# Patient Record
Sex: Male | Born: 1940 | Race: White | Hispanic: No | Smoking: Never smoker
Health system: Southern US, Community
[De-identification: ages and names within clinical notes are randomized; demographics above are authoritative.]

## PROBLEM LIST (undated history)

## (undated) DIAGNOSIS — I10 Essential (primary) hypertension: Secondary | ICD-10-CM

## (undated) DIAGNOSIS — M1990 Unspecified osteoarthritis, unspecified site: Secondary | ICD-10-CM

## (undated) DIAGNOSIS — H9190 Unspecified hearing loss, unspecified ear: Secondary | ICD-10-CM

## (undated) DIAGNOSIS — Z87442 Personal history of urinary calculi: Secondary | ICD-10-CM

## (undated) DIAGNOSIS — H269 Unspecified cataract: Secondary | ICD-10-CM

## (undated) HISTORY — PX: APPENDECTOMY: SHX54

---

## 1999-07-06 ENCOUNTER — Encounter: Payer: Self-pay | Admitting: Urology

## 1999-07-10 ENCOUNTER — Ambulatory Visit (HOSPITAL_COMMUNITY): Admission: RE | Admit: 1999-07-10 | Discharge: 1999-07-11 | Payer: Self-pay | Admitting: Urology

## 2001-08-03 ENCOUNTER — Encounter: Admission: RE | Admit: 2001-08-03 | Discharge: 2001-08-03 | Payer: Self-pay | Admitting: Urology

## 2001-08-03 ENCOUNTER — Encounter: Payer: Self-pay | Admitting: Urology

## 2001-08-04 ENCOUNTER — Ambulatory Visit (HOSPITAL_BASED_OUTPATIENT_CLINIC_OR_DEPARTMENT_OTHER): Admission: RE | Admit: 2001-08-04 | Discharge: 2001-08-04 | Payer: Self-pay | Admitting: Urology

## 2001-09-14 ENCOUNTER — Emergency Department (HOSPITAL_COMMUNITY): Admission: EM | Admit: 2001-09-14 | Discharge: 2001-09-14 | Payer: Self-pay | Admitting: Emergency Medicine

## 2005-01-25 ENCOUNTER — Encounter: Admission: RE | Admit: 2005-01-25 | Discharge: 2005-01-25 | Payer: Self-pay | Admitting: Otolaryngology

## 2005-09-13 ENCOUNTER — Ambulatory Visit: Payer: Self-pay | Admitting: Unknown Physician Specialty

## 2008-02-13 ENCOUNTER — Other Ambulatory Visit: Payer: Self-pay

## 2008-02-14 ENCOUNTER — Inpatient Hospital Stay: Payer: Self-pay | Admitting: Internal Medicine

## 2008-04-14 ENCOUNTER — Ambulatory Visit: Payer: Self-pay | Admitting: Family Medicine

## 2013-03-26 ENCOUNTER — Ambulatory Visit: Payer: Self-pay | Admitting: Gastroenterology

## 2013-03-30 LAB — PATHOLOGY REPORT

## 2014-10-07 DIAGNOSIS — I10 Essential (primary) hypertension: Secondary | ICD-10-CM | POA: Diagnosis not present

## 2014-10-07 DIAGNOSIS — E785 Hyperlipidemia, unspecified: Secondary | ICD-10-CM | POA: Diagnosis not present

## 2014-10-07 DIAGNOSIS — Z125 Encounter for screening for malignant neoplasm of prostate: Secondary | ICD-10-CM | POA: Diagnosis not present

## 2014-10-07 DIAGNOSIS — R202 Paresthesia of skin: Secondary | ICD-10-CM | POA: Diagnosis not present

## 2015-05-03 DIAGNOSIS — J3489 Other specified disorders of nose and nasal sinuses: Secondary | ICD-10-CM | POA: Diagnosis not present

## 2015-09-18 DIAGNOSIS — E538 Deficiency of other specified B group vitamins: Secondary | ICD-10-CM | POA: Diagnosis not present

## 2015-09-18 DIAGNOSIS — Z0000 Encounter for general adult medical examination without abnormal findings: Secondary | ICD-10-CM | POA: Diagnosis not present

## 2015-09-18 DIAGNOSIS — Z125 Encounter for screening for malignant neoplasm of prostate: Secondary | ICD-10-CM | POA: Diagnosis not present

## 2015-09-18 DIAGNOSIS — I10 Essential (primary) hypertension: Secondary | ICD-10-CM | POA: Diagnosis not present

## 2015-09-18 DIAGNOSIS — E785 Hyperlipidemia, unspecified: Secondary | ICD-10-CM | POA: Diagnosis not present

## 2015-10-09 DIAGNOSIS — E785 Hyperlipidemia, unspecified: Secondary | ICD-10-CM | POA: Diagnosis not present

## 2015-10-09 DIAGNOSIS — I10 Essential (primary) hypertension: Secondary | ICD-10-CM | POA: Diagnosis not present

## 2015-10-09 DIAGNOSIS — E538 Deficiency of other specified B group vitamins: Secondary | ICD-10-CM | POA: Diagnosis not present

## 2015-10-09 DIAGNOSIS — Z125 Encounter for screening for malignant neoplasm of prostate: Secondary | ICD-10-CM | POA: Diagnosis not present

## 2016-03-20 DIAGNOSIS — I10 Essential (primary) hypertension: Secondary | ICD-10-CM | POA: Insufficient documentation

## 2016-03-20 DIAGNOSIS — E538 Deficiency of other specified B group vitamins: Secondary | ICD-10-CM | POA: Insufficient documentation

## 2016-03-20 DIAGNOSIS — J302 Other seasonal allergic rhinitis: Secondary | ICD-10-CM | POA: Diagnosis not present

## 2016-03-29 DIAGNOSIS — E538 Deficiency of other specified B group vitamins: Secondary | ICD-10-CM | POA: Diagnosis not present

## 2016-04-22 ENCOUNTER — Emergency Department: Payer: Medicare HMO

## 2016-04-22 ENCOUNTER — Emergency Department
Admission: EM | Admit: 2016-04-22 | Discharge: 2016-04-22 | Disposition: A | Discharge status: Home / Self Care | Payer: Medicare HMO | Attending: Emergency Medicine | Admitting: Emergency Medicine

## 2016-04-22 ENCOUNTER — Encounter: Payer: Self-pay | Admitting: Emergency Medicine

## 2016-04-22 DIAGNOSIS — M25512 Pain in left shoulder: Secondary | ICD-10-CM | POA: Diagnosis not present

## 2016-04-22 DIAGNOSIS — I10 Essential (primary) hypertension: Secondary | ICD-10-CM | POA: Insufficient documentation

## 2016-04-22 DIAGNOSIS — R079 Chest pain, unspecified: Secondary | ICD-10-CM | POA: Diagnosis not present

## 2016-04-22 HISTORY — DX: Essential (primary) hypertension: I10

## 2016-04-22 LAB — COMPREHENSIVE METABOLIC PANEL
ALBUMIN: 4.4 g/dL (ref 3.5–5.0)
ALT: 18 U/L (ref 17–63)
ANION GAP: 6 (ref 5–15)
AST: 20 U/L (ref 15–41)
Alkaline Phosphatase: 38 U/L (ref 38–126)
BUN: 31 mg/dL — ABNORMAL HIGH (ref 6–20)
CO2: 25 mmol/L (ref 22–32)
Calcium: 9.3 mg/dL (ref 8.9–10.3)
Chloride: 106 mmol/L (ref 101–111)
Creatinine, Ser: 1.41 mg/dL — ABNORMAL HIGH (ref 0.61–1.24)
GFR calc Af Amer: 55 mL/min — ABNORMAL LOW (ref >60)
GFR calc non Af Amer: 47 mL/min — ABNORMAL LOW (ref >60)
GLUCOSE: 114 mg/dL — AB (ref 65–99)
POTASSIUM: 4.2 mmol/L (ref 3.5–5.1)
SODIUM: 137 mmol/L (ref 135–145)
TOTAL PROTEIN: 7.5 g/dL (ref 6.5–8.1)
Total Bilirubin: 0.6 mg/dL (ref 0.3–1.2)

## 2016-04-22 LAB — CBC
HEMATOCRIT: 39 % — AB (ref 40.0–52.0)
HEMOGLOBIN: 13.3 g/dL (ref 13.0–18.0)
MCH: 29 pg (ref 26.0–34.0)
MCHC: 34.1 g/dL (ref 32.0–36.0)
MCV: 85.3 fL (ref 80.0–100.0)
Platelets: 157 10*3/uL (ref 150–440)
RBC: 4.57 MIL/uL (ref 4.40–5.90)
RDW: 17.8 % — ABNORMAL HIGH (ref 11.5–14.5)
WBC: 5.2 10*3/uL (ref 3.8–10.6)

## 2016-04-22 LAB — TROPONIN I: Troponin I: <0.03 ng/mL (ref <0.03)

## 2016-04-22 MED ORDER — SENNA 8.6 MG PO TABS: 1.0000 | ORAL_TABLET | Freq: Every day | ORAL | 0 refills | Status: DC

## 2016-04-22 MED ORDER — OXYCODONE HCL 5 MG PO TABS: 5.0000 mg | ORAL_TABLET | Freq: Four times a day (QID) | ORAL | 0 refills | Status: AC | PRN

## 2016-04-22 MED ORDER — ACETAMINOPHEN 500 MG PO TABS
1000.0000 mg | ORAL_TABLET | Freq: Once | ORAL | Status: AC
  Administered 2016-04-22: 1000 mg via ORAL
  Filled 2016-04-22: qty 2

## 2016-04-22 NOTE — Discharge Instructions (Signed)
For pain control take tylenol 1000mg  every 8 hours. If the pain is not well controlled with tylenol only take 1 oxycodone every 6 hours. Apply heat to the painful area. Follow up with orthopedics for further evaluation. Return to the ER if you have chest pain, shortness of breath, feels dizzy, of if any new symptoms develop.

## 2016-04-22 NOTE — ED Provider Notes (Signed)
[facility] Emergency Department Provider Note  ____________________________________________  Time seen: Approximately 11:36 AM  I have reviewed the triage vital signs and the nursing notes.   HISTORY  Chief Complaint Chest Pain   HPI Juan [name] is a 75 y.o. male history of hypertension presents for evaluation of left shoulder pain. 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 The next day in the evening he developed pain in his left shoulder. He reports that initially the pain was severe, located in his left shoulder, radiating across his left chest. The pain in the shoulder and the chest had been constant over the weekend. He reports that over the course of the last 2 days he has applied heat and taken Tylenol and the soreness in the chest is now resolved. But he continues to have pain in his left axilla. He reports that the pain is worse when his arm is laying down but better when he lifts his arm up. Currently laying in bed talking to me he reports no pain. He was concerned that he was having a heart attack. Patient has no history of heart disease. He thinks that his father had some heart problems but he is not sure. He is not a smoker. He denies nausea, lightheadedness, shortness of breath associated with this episode.  Past Medical History:  Diagnosis Date  . Hypertension     There are no active problems to display for this patient.   History reviewed. No pertinent surgical history.  Prior to Admission medications   Medication Sig Start Date End Date Taking? Authorizing Provider  oxyCODONE (ROXICODONE) 5 MG immediate release tablet Take 1 tablet (5 mg total) by mouth every 6 (six) hours as needed for moderate pain or severe pain. 04/22/16 04/22/17  Rudene Re, MD    Allergies Sulfa antibiotics  History reviewed. No pertinent family history.  Social  History Social History  Substance Use Topics  . Smoking status: Never Smoker  . Smokeless tobacco: Never Used  . Alcohol use No    Review of Systems  Constitutional: Negative for fever. Eyes: Negative for visual changes. ENT: Negative for sore throat. Cardiovascular: Negative for chest pain. Respiratory: Negative for shortness of breath. Gastrointestinal: Negative for abdominal pain, vomiting or diarrhea. Genitourinary: Negative for dysuria. Musculoskeletal: Negative for back pain. + Left shoulder pain Skin: Negative for rash. Neurological: Negative for headaches, weakness or numbness.  ____________________________________________   PHYSICAL EXAM:  VITAL SIGNS: ED Triage Vitals [04/22/16 1037]  Enc Vitals Group     BP (!) 147/72     Pulse Rate (!) 48     Resp 20     Temp 98.2 F (36.8 C)     Temp Source Oral     SpO2 96 %     Weight 140 lb (63.5 kg)     Height 5\' 5"  (1.651 m)     Head Circumference      Peak Flow      Pain Score 8     Pain Loc      Pain Edu?      Excl. in [place]?     Constitutional: Alert and oriented. Well appearing and in no apparent distress. HEENT:      Head: Normocephalic and atraumatic.         Eyes: Conjunctivae are normal. Sclera is non-icteric. EOMI. PERRL  Mouth/Throat: Mucous membranes are moist.       Neck: Supple with no signs of meningismus. Cardiovascular: Regular rate and rhythm. No murmurs, gallops, or rubs. 2+ symmetrical distal pulses are present in all extremities. No JVD. Respiratory: Normal respiratory effort. Lungs are clear to auscultation bilaterally. No wheezes, crackles, or rhonchi.  Gastrointestinal: Soft, non tender, and non distended with positive bowel sounds. No rebound or guarding. Musculoskeletal: Mild ttp over the lateral aspect of the trapezius on the left, full painless ROM of the L shoulder, no deformities. Nontender with normal range of motion in all extremities. No edema, cyanosis, or erythema of  extremities. Neurologic: Normal speech and language. Face is symmetric. Moving all extremities. No gross focal neurologic deficits are appreciated. Skin: Skin is warm, dry and intact. No rash noted. Psychiatric: Mood and affect are normal. Speech and behavior are normal.  ____________________________________________   LABS (all labs ordered are listed, but only abnormal results are displayed)  Labs Reviewed  COMPREHENSIVE METABOLIC PANEL - Abnormal; Notable for the following:       Result Value   Glucose, Bld 114 (*)    BUN 31 (*)    Creatinine, Ser 1.41 (*)    GFR calc non Af Amer 47 (*)    GFR calc Af Amer 55 (*)    All other components within normal limits  CBC - Abnormal; Notable for the following:    HCT 39.0 (*)    RDW 17.8 (*)    All other components within normal limits  TROPONIN I   ____________________________________________  EKG  ED ECG REPORT I, Rudene Re, the attending physician, personally viewed and interpreted this ECG.  Sinus bradycardia, rate of 49, normal intervals, normal axis, no ST elevations or depressions. Unchanged from prior ____________________________________________  RADIOLOGY  CXR: Negative  XR left shoulder: Negative  ____________________________________________   PROCEDURES  Procedure(s) performed: None Procedures Critical Care performed:  None ____________________________________________   INITIAL IMPRESSION / ASSESSMENT AND PLAN / ED COURSE  75 y.o. 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 the pain is soreness, and worse with movement, and reproducible on palpation making most likely MSK pain. History not consistent with ACS, heart score of 1, EKG with no ischemic changes, troponin done 6 hours after pain was resolved in his chest is negative.  Will get XR of the shoulder and treat pain with tylenol. If XR negative will refer to Dr. Rosey Bath, orthopedics for rotator cuff vs brachial plexus injury.  Clinical Course   _________________________ 12:39 PM on 04/22/2016 -----------------------------------------  Shoulder XR negative, Will dc home on standing tylenol, oxycodone as needed with bowel regimen, and f/u with orthopedics.   Pertinent labs & imaging results that were available during my care of the patient were reviewed by me and considered in my medical decision making (see chart for details).   I discussed my evaluation of the patient's symptoms, my clinical impression, and my proposed outpatient treatment plan with patient. We have discussed anticipatory guidance, scheduled follow-up, and careful return precautions. The patient expresses understanding and is comfortable with the discharge plan. All patient's questions were answered.    ____________________________________________   FINAL CLINICAL IMPRESSION(S) / ED DIAGNOSES  Final diagnoses:  Left shoulder pain      NEW MEDICATIONS STARTED DURING THIS VISIT:  New Prescriptions  OXYCODONE (ROXICODONE) 5 MG IMMEDIATE RELEASE TABLET    Take 1 tablet (5 mg total) by mouth every 6 (six) hours as needed for moderate pain or severe pain.     Note:  This document was prepared using Dragon voice recognition software and may include unintentional dictation errors.    Rudene Re, MD 04/22/16 1240

## 2016-04-22 NOTE — ED Triage Notes (Signed)
Pt to ed with c/o chest pain on 9/1,  Reports nausea, and pain under left arm at this time. States pain worse when hanging down, better if raised above head. Pt sent from [facility].

## 2016-04-23 DIAGNOSIS — E538 Deficiency of other specified B group vitamins: Secondary | ICD-10-CM | POA: Diagnosis not present

## 2016-05-28 DIAGNOSIS — E538 Deficiency of other specified B group vitamins: Secondary | ICD-10-CM | POA: Diagnosis not present

## 2016-06-28 DIAGNOSIS — E538 Deficiency of other specified B group vitamins: Secondary | ICD-10-CM | POA: Diagnosis not present

## 2016-08-29 DIAGNOSIS — E538 Deficiency of other specified B group vitamins: Secondary | ICD-10-CM | POA: Diagnosis not present

## 2016-09-23 DIAGNOSIS — E538 Deficiency of other specified B group vitamins: Secondary | ICD-10-CM | POA: Diagnosis not present

## 2016-09-23 DIAGNOSIS — Z125 Encounter for screening for malignant neoplasm of prostate: Secondary | ICD-10-CM | POA: Diagnosis not present

## 2016-09-23 DIAGNOSIS — E782 Mixed hyperlipidemia: Secondary | ICD-10-CM | POA: Diagnosis not present

## 2016-09-23 DIAGNOSIS — I10 Essential (primary) hypertension: Secondary | ICD-10-CM | POA: Diagnosis not present

## 2016-09-23 DIAGNOSIS — Z0000 Encounter for general adult medical examination without abnormal findings: Secondary | ICD-10-CM | POA: Diagnosis not present

## 2016-09-30 DIAGNOSIS — I10 Essential (primary) hypertension: Secondary | ICD-10-CM | POA: Diagnosis not present

## 2016-09-30 DIAGNOSIS — E538 Deficiency of other specified B group vitamins: Secondary | ICD-10-CM | POA: Diagnosis not present

## 2016-09-30 DIAGNOSIS — Z125 Encounter for screening for malignant neoplasm of prostate: Secondary | ICD-10-CM | POA: Diagnosis not present

## 2016-09-30 DIAGNOSIS — E782 Mixed hyperlipidemia: Secondary | ICD-10-CM | POA: Diagnosis not present

## 2016-10-29 DIAGNOSIS — E538 Deficiency of other specified B group vitamins: Secondary | ICD-10-CM | POA: Diagnosis not present

## 2016-12-03 DIAGNOSIS — E538 Deficiency of other specified B group vitamins: Secondary | ICD-10-CM | POA: Diagnosis not present

## 2017-01-02 DIAGNOSIS — E538 Deficiency of other specified B group vitamins: Secondary | ICD-10-CM | POA: Diagnosis not present

## 2017-02-04 DIAGNOSIS — E538 Deficiency of other specified B group vitamins: Secondary | ICD-10-CM | POA: Diagnosis not present

## 2017-03-03 DIAGNOSIS — R229 Localized swelling, mass and lump, unspecified: Secondary | ICD-10-CM | POA: Diagnosis not present

## 2017-03-06 DIAGNOSIS — E538 Deficiency of other specified B group vitamins: Secondary | ICD-10-CM | POA: Diagnosis not present

## 2017-04-07 DIAGNOSIS — R972 Elevated prostate specific antigen [PSA]: Secondary | ICD-10-CM | POA: Diagnosis not present

## 2017-04-07 DIAGNOSIS — E538 Deficiency of other specified B group vitamins: Secondary | ICD-10-CM | POA: Diagnosis not present

## 2017-04-07 DIAGNOSIS — I10 Essential (primary) hypertension: Secondary | ICD-10-CM | POA: Diagnosis not present

## 2017-05-08 DIAGNOSIS — E538 Deficiency of other specified B group vitamins: Secondary | ICD-10-CM | POA: Diagnosis not present

## 2017-06-10 DIAGNOSIS — Z23 Encounter for immunization: Secondary | ICD-10-CM | POA: Diagnosis not present

## 2017-06-10 DIAGNOSIS — E538 Deficiency of other specified B group vitamins: Secondary | ICD-10-CM | POA: Diagnosis not present

## 2017-07-15 DIAGNOSIS — E538 Deficiency of other specified B group vitamins: Secondary | ICD-10-CM | POA: Diagnosis not present

## 2017-08-21 DIAGNOSIS — E538 Deficiency of other specified B group vitamins: Secondary | ICD-10-CM | POA: Diagnosis not present

## 2017-09-23 DIAGNOSIS — E538 Deficiency of other specified B group vitamins: Secondary | ICD-10-CM | POA: Diagnosis not present

## 2017-10-21 DIAGNOSIS — E538 Deficiency of other specified B group vitamins: Secondary | ICD-10-CM | POA: Diagnosis not present

## 2017-11-25 DIAGNOSIS — E538 Deficiency of other specified B group vitamins: Secondary | ICD-10-CM | POA: Diagnosis not present

## 2017-12-30 DIAGNOSIS — E538 Deficiency of other specified B group vitamins: Secondary | ICD-10-CM | POA: Diagnosis not present

## 2018-02-03 DIAGNOSIS — E538 Deficiency of other specified B group vitamins: Secondary | ICD-10-CM | POA: Diagnosis not present

## 2018-03-10 DIAGNOSIS — E538 Deficiency of other specified B group vitamins: Secondary | ICD-10-CM | POA: Diagnosis not present

## 2018-03-25 DIAGNOSIS — M25611 Stiffness of right shoulder, not elsewhere classified: Secondary | ICD-10-CM | POA: Diagnosis not present

## 2018-03-25 DIAGNOSIS — M25511 Pain in right shoulder: Secondary | ICD-10-CM | POA: Diagnosis not present

## 2018-03-30 IMAGING — CR DG SHOULDER 2+V*L*
3 series · 3 of 3 positions shown · non-contrast
Comparison: None.

CLINICAL DATA: Left shoulder pain.

EXAM:
LEFT SHOULDER - 2+ VIEW

[shoulder grashey]
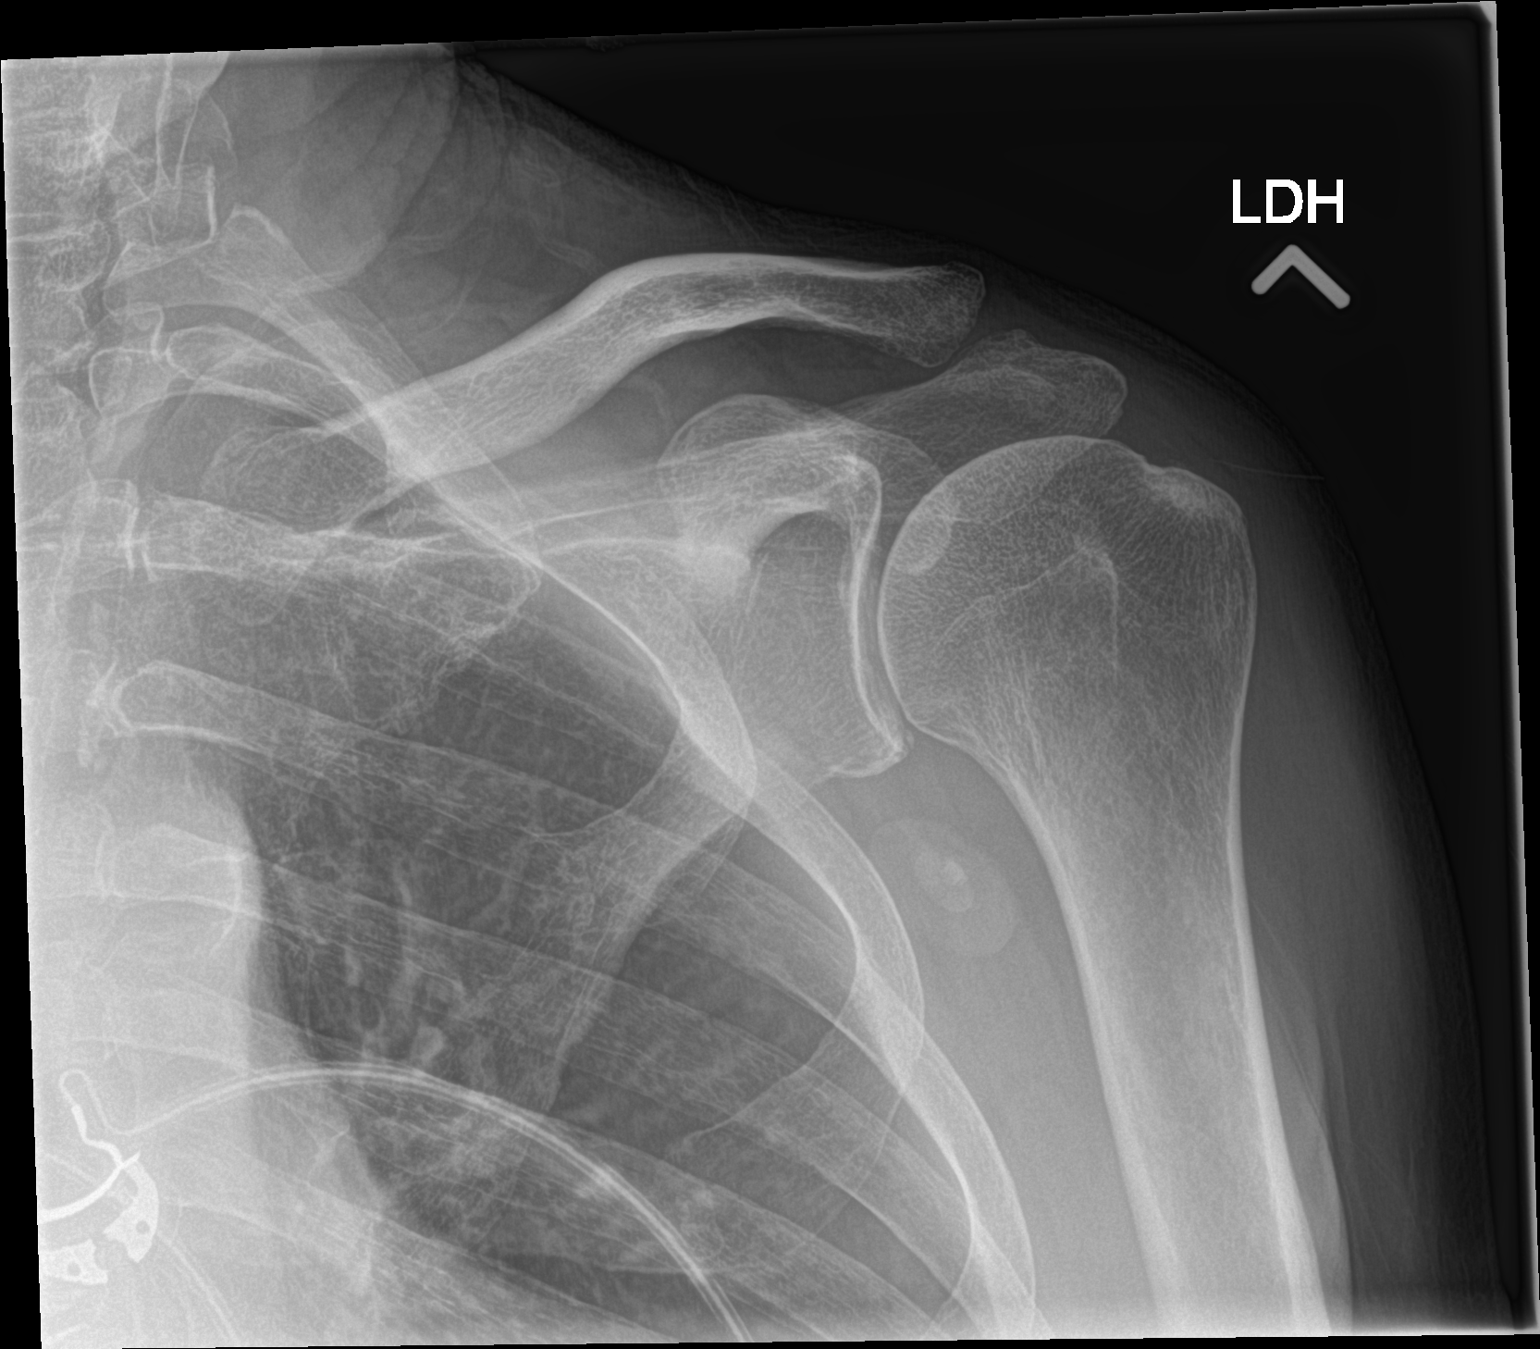

[shoulder y view]
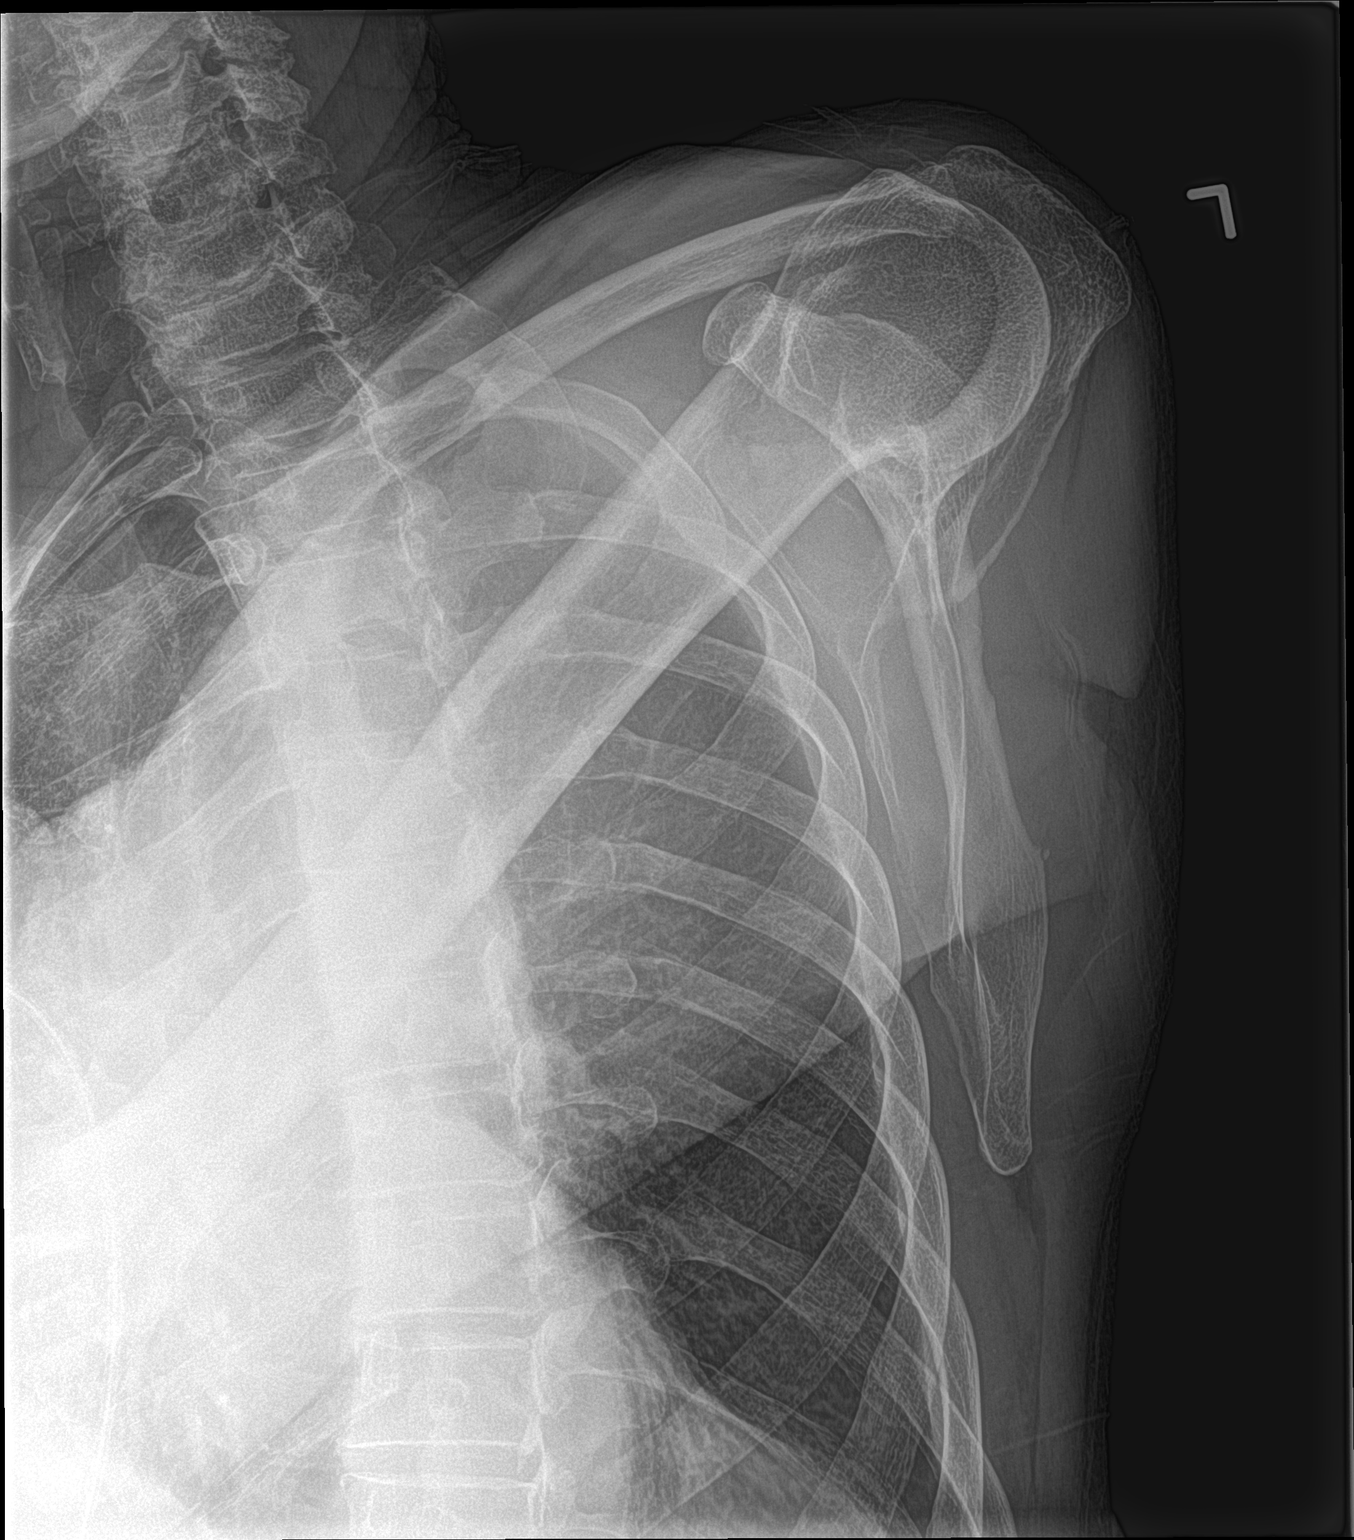

[shoulder axillary]
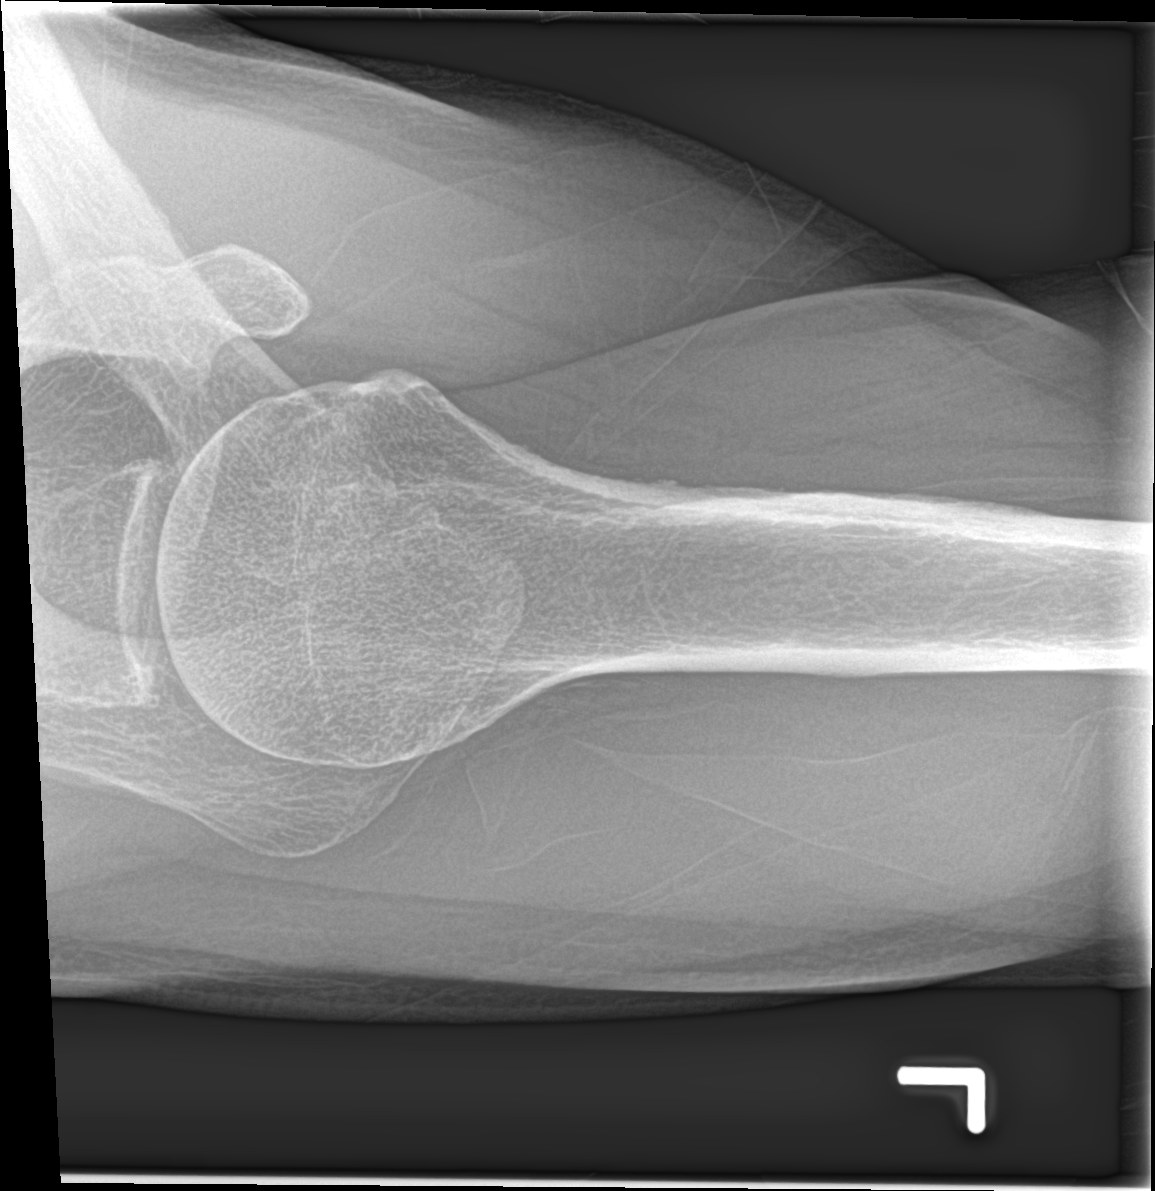

[3 of 3 positions shown; findings below may reference images not displayed]

FINDINGS: There is no evidence of fracture or dislocation. There is no
evidence of arthropathy or other focal bone abnormality. Soft
tissues are unremarkable.
IMPRESSION: Negative.

## 2018-04-10 DIAGNOSIS — E538 Deficiency of other specified B group vitamins: Secondary | ICD-10-CM | POA: Diagnosis not present

## 2018-05-12 DIAGNOSIS — E538 Deficiency of other specified B group vitamins: Secondary | ICD-10-CM | POA: Diagnosis not present

## 2018-06-02 DIAGNOSIS — Z23 Encounter for immunization: Secondary | ICD-10-CM | POA: Diagnosis not present

## 2018-06-16 DIAGNOSIS — E538 Deficiency of other specified B group vitamins: Secondary | ICD-10-CM | POA: Diagnosis not present

## 2018-06-26 DIAGNOSIS — Z125 Encounter for screening for malignant neoplasm of prostate: Secondary | ICD-10-CM | POA: Diagnosis not present

## 2018-06-26 DIAGNOSIS — E785 Hyperlipidemia, unspecified: Secondary | ICD-10-CM | POA: Insufficient documentation

## 2018-06-26 DIAGNOSIS — I10 Essential (primary) hypertension: Secondary | ICD-10-CM | POA: Diagnosis not present

## 2018-06-26 DIAGNOSIS — M25511 Pain in right shoulder: Secondary | ICD-10-CM | POA: Diagnosis not present

## 2018-06-26 DIAGNOSIS — E538 Deficiency of other specified B group vitamins: Secondary | ICD-10-CM | POA: Diagnosis not present

## 2018-06-26 DIAGNOSIS — Z0000 Encounter for general adult medical examination without abnormal findings: Secondary | ICD-10-CM | POA: Diagnosis not present

## 2018-07-30 DIAGNOSIS — Z86010 Personal history of colonic polyps: Secondary | ICD-10-CM | POA: Diagnosis not present

## 2018-07-30 DIAGNOSIS — K6289 Other specified diseases of anus and rectum: Secondary | ICD-10-CM | POA: Diagnosis not present

## 2018-07-30 DIAGNOSIS — K625 Hemorrhage of anus and rectum: Secondary | ICD-10-CM | POA: Diagnosis not present

## 2018-09-28 ENCOUNTER — Encounter: Payer: Self-pay | Admitting: *Deleted

## 2018-09-29 ENCOUNTER — Encounter: Payer: Self-pay | Admitting: Student

## 2018-09-29 ENCOUNTER — Encounter
Admission: RE | Disposition: A | Discharge status: Home / Self Care | Payer: Self-pay | Source: Ambulatory Visit | Attending: Gastroenterology

## 2018-09-29 ENCOUNTER — Ambulatory Visit: Payer: Medicare HMO | Admitting: Anesthesiology

## 2018-09-29 ENCOUNTER — Ambulatory Visit
Admission: RE | Admit: 2018-09-29 | Discharge: 2018-09-29 | Disposition: A | Discharge status: Home / Self Care | Payer: Medicare HMO | Source: Ambulatory Visit | Attending: Gastroenterology | Admitting: Gastroenterology

## 2018-09-29 ENCOUNTER — Other Ambulatory Visit: Payer: Self-pay

## 2018-09-29 DIAGNOSIS — Z882 Allergy status to sulfonamides status: Secondary | ICD-10-CM | POA: Insufficient documentation

## 2018-09-29 DIAGNOSIS — Z87442 Personal history of urinary calculi: Secondary | ICD-10-CM | POA: Insufficient documentation

## 2018-09-29 DIAGNOSIS — I10 Essential (primary) hypertension: Secondary | ICD-10-CM | POA: Diagnosis not present

## 2018-09-29 DIAGNOSIS — Z7952 Long term (current) use of systemic steroids: Secondary | ICD-10-CM | POA: Diagnosis not present

## 2018-09-29 DIAGNOSIS — Z86010 Personal history of colonic polyps: Secondary | ICD-10-CM | POA: Diagnosis present

## 2018-09-29 DIAGNOSIS — Z79899 Other long term (current) drug therapy: Secondary | ICD-10-CM | POA: Diagnosis not present

## 2018-09-29 DIAGNOSIS — K621 Rectal polyp: Secondary | ICD-10-CM | POA: Diagnosis not present

## 2018-09-29 DIAGNOSIS — K5731 Diverticulosis of large intestine without perforation or abscess with bleeding: Secondary | ICD-10-CM | POA: Insufficient documentation

## 2018-09-29 HISTORY — DX: Unspecified osteoarthritis, unspecified site: M19.90

## 2018-09-29 HISTORY — PX: COLONOSCOPY WITH PROPOFOL: SHX5780

## 2018-09-29 HISTORY — DX: Personal history of urinary calculi: Z87.442

## 2018-09-29 SURGERY — COLONOSCOPY WITH PROPOFOL
Anesthesia: General

## 2018-09-29 MED ORDER — FENTANYL CITRATE (PF) 100 MCG/2ML IJ SOLN
INTRAMUSCULAR | Status: AC
Start: 2018-09-29 — End: ?
  Filled 2018-09-29: qty 2

## 2018-09-29 MED ORDER — PROPOFOL 10 MG/ML IV BOLUS
INTRAVENOUS | Status: DC | PRN
  Administered 2018-09-29: 50 mg via INTRAVENOUS
  Administered 2018-09-29 (×3): 30 mg via INTRAVENOUS
  Administered 2018-09-29: 10 mg via INTRAVENOUS

## 2018-09-29 MED ORDER — SODIUM CHLORIDE 0.9 % IV SOLN
INTRAVENOUS | Status: DC | PRN
  Administered 2018-09-29: 14:00:00 via INTRAVENOUS

## 2018-09-29 MED ORDER — PROPOFOL 500 MG/50ML IV EMUL
INTRAVENOUS | Status: AC
  Filled 2018-09-29: qty 50

## 2018-09-29 MED ORDER — LIDOCAINE HCL (PF) 2 % IJ SOLN
INTRAMUSCULAR | Status: AC
  Filled 2018-09-29: qty 10

## 2018-09-29 MED ORDER — LIDOCAINE HCL (CARDIAC) PF 100 MG/5ML IV SOSY
PREFILLED_SYRINGE | INTRAVENOUS | Status: DC | PRN
  Administered 2018-09-29: 30 mg via INTRATRACHEAL

## 2018-09-29 MED ORDER — PROPOFOL 500 MG/50ML IV EMUL
INTRAVENOUS | Status: DC | PRN
  Administered 2018-09-29: 100 ug/kg/min via INTRAVENOUS

## 2018-09-29 NOTE — Anesthesia Preprocedure Evaluation (Signed)
Anesthesia Evaluation  Patient identified by MRN, date of birth, ID band Patient awake    Reviewed: Allergy & Precautions, NPO status , Patient's Chart, lab work & pertinent test results  History of Anesthesia Complications Negative for: history of anesthetic complications  Airway Mallampati: II  TM Distance: >3 FB Neck ROM: Full    Dental no notable dental hx.    Pulmonary neg pulmonary ROS, neg sleep apnea, neg COPD,    breath sounds clear to auscultation- rhonchi (-) wheezing      Cardiovascular hypertension, Pt. on medications (-) CAD, (-) Past MI, (-) Cardiac Stents and (-) CABG  Rhythm:Regular Rate:Normal - Systolic murmurs and - Diastolic murmurs    Neuro/Psych neg Seizures negative neurological ROS  negative psych ROS   GI/Hepatic negative GI ROS, Neg liver ROS,   Endo/Other  negative endocrine ROSneg diabetes  Renal/GU negative Renal ROS     Musculoskeletal  (+) Arthritis ,   Abdominal (+) - obese,   Peds  Hematology negative hematology ROS (+)   Anesthesia Other Findings Past Medical History: No date: Arthritis No date: History of kidney stones No date: Hypertension   Reproductive/Obstetrics                             Anesthesia Physical Anesthesia Plan  ASA: II  Anesthesia Plan: General   Post-op Pain Management:    Induction: Intravenous  PONV Risk Score and Plan: 1 and Propofol infusion  Airway Management Planned: Natural Airway  Additional Equipment:   Intra-op Plan:   Post-operative Plan:   Informed Consent: I have reviewed the patients History and Physical, chart, labs and discussed the procedure including the risks, benefits and alternatives for the proposed anesthesia with the patient or authorized representative who has indicated his/her understanding and acceptance.     Dental advisory given  Plan Discussed with: CRNA and  Anesthesiologist  Anesthesia Plan Comments:         Anesthesia Quick Evaluation

## 2018-09-29 NOTE — Transfer of Care (Signed)
Immediate Anesthesia Transfer of Care Note  Patient: Juan [name]  Procedure(s) Performed: COLONOSCOPY WITH PROPOFOL (N/A )  Patient Location: Endoscopy Unit  Anesthesia Type:General  Level of Consciousness: drowsy  Airway & Oxygen Therapy: Patient Spontanous Breathing and Patient connected to nasal cannula oxygen  Post-op Assessment: Report given to RN and Post -op Vital signs reviewed and stable  Post vital signs: stable  Last Vitals:  Vitals Value Taken Time  BP 102/61 09/29/2018  2:24 PM  Temp 35.9 C 09/29/2018  2:22 PM  Pulse 61 09/29/2018  2:26 PM  Resp 19 09/29/2018  2:26 PM  SpO2 100 % 09/29/2018  2:26 PM  Vitals shown include unvalidated device data.  Last Pain:  Vitals:   09/29/18 1422  TempSrc: Tympanic  PainSc:          Complications: No apparent anesthesia complications

## 2018-09-29 NOTE — Op Note (Addendum)
[facility] Patient Name: Juan [name] Procedure Date: 09/29/2018 1:23 PM MRN: [id number] Account #: [id number] Date of Birth: 1941-07-30 Admit Type: Outpatient Age: 78 Room: [facility] ENDO ROOM 2 Gender: Male Note Status: Finalized Procedure:            Colonoscopy Indications:          Personal history of colonic polyps Providers:            Lollie Sails, MD Referring MD:         Youlanda Roys. Lovie Macadamia, MD (Referring MD) Medicines:            Monitored Anesthesia Care Complications:        No immediate complications. Polypectomy sites had good                        hemostasis. Procedure:            Pre-Anesthesia Assessment:                       - ASA Grade Assessment: II - A patient with mild                        systemic disease.                       After obtaining informed consent, the colonoscope was                        passed under direct vision. Throughout the procedure,                        the patient's blood pressure, pulse, and oxygen                        saturations were monitored continuously. The                        Colonoscope was introduced through the anus and                        advanced to the the cecum, identified by appendiceal                        orifice and ileocecal valve. The colonoscopy was                        unusually difficult due to poor bowel prep with stool                        present. The patient tolerated the procedure well. Findings:      Multiple small and large-mouthed diverticula were found in the sigmoid       colon and descending colon.      A 3 mm polyp was found in the rectum. The polyp was sessile. The polyp       was removed with a cold snare. Resection and retrieval were complete.      A 3 mm polyp was found in the hepatic flexure. The polyp was sessile.       The polyp was removed with a cold biopsy forceps. Resection and       retrieval were  complete.      A 20 mm polyp  was found at 70 cm proximal to the anus. The polyp was       pedunculated with a broad base. I bypassed this to go on to the cecum       with removal on egress, however I could not relocate on egress due to       poor prep.      A moderate amount of semi-solid solid stool was found in the sigmoid       colon and in the descending colon, precluding visualization.      A large amount of semi-solid solid stool was found in the proximal       transverse colon, at the hepatic flexure, in the ascending colon and in       the cecum, precluding visualization. This could not be removed by       suction. Impression:           - Diverticulosis in the sigmoid colon and in the                        descending colon.                       - One 3 mm polyp in the rectum, removed with a cold                        snare. Resected and retrieved.                       - One 3 mm polyp at the hepatic flexure, removed with a                        cold biopsy forceps. Resected and retrieved.                       - One 20 mm polyp at 70 cm proximal to the anus.                       - Stool in the sigmoid colon and in the descending                        colon.                       - Stool in the proximal transverse colon, at the                        hepatic flexure, in the ascending colon and in the                        cecum. Recommendation:       - Discharge patient to home. repeat prep and reschedule. Procedure Code(s):    --- Professional ---                       [phone], Colonoscopy, flexible; with removal of tumor(s),                        polyp(s), or other lesion(s) by snare technique  29021, 90, Colonoscopy, flexible; with biopsy, single                        or multiple Diagnosis Code(s):    --- Professional ---                       K62.1, Rectal polyp                       D12.3, Benign neoplasm of transverse colon (hepatic                        flexure or  splenic flexure)                       Z86.010, Personal history of colonic polyps                       K57.30, Diverticulosis of large intestine without                        perforation or abscess without bleeding CPT copyright 2018 American [employer] Association. All rights reserved. The codes documented in this report are preliminary and upon coder review may  be revised to meet current compliance requirements. Lollie Sails, MD 09/29/2018 2:26:55 PM This report has been signed electronically. Number of Addenda: 0 Note Initiated On: 09/29/2018 1:23 PM Scope Withdrawal Time: 0 hours 1 minute 51 seconds  Total Procedure Duration: 0 hours 18 minutes 37 seconds       [facility]

## 2018-09-29 NOTE — Anesthesia Post-op Follow-up Note (Signed)
Anesthesia QCDR form completed.        

## 2018-09-29 NOTE — H&P (Addendum)
Outpatient short stay form Pre-procedure 09/29/2018 1:20 PM Lollie Sails MD  Primary Physician: Dr. Juluis Pitch  Reason for visit: Colonoscopy  History of present illness: Patient is a 78 year old male presenting today for colonoscopy in regards to his personal history of adenomatous colon polyps.  His last procedure was 03/26/2013.  He has been having some occasional rectal bleeding rectal pain.  Patient was a bit confused about his medications this morning and stated that he was on a blood thinner.  However multiple requests and sourcing he is not taking a blood thinner has not filled a blood thinner at his pharmacy and his primary doctor does not have a record of a blood thinner.  He states he sees no other primary physician except Dr. Lovie Macadamia.   No current facility-administered medications for this encounter.   Medications Prior to Admission  Medication Sig Dispense Refill Last Dose  . atorvastatin (LIPITOR) 20 MG tablet Take 20 mg by mouth daily.   Past Week at Unknown time  . cyclobenzaprine (FLEXERIL) 5 MG tablet Take 5 mg by mouth 3 (three) times daily as needed for muscle spasms.   Past Week at Unknown time  . Fish Oil-Cholecalciferol (OMEGA-3 FISH OIL/VITAMIN D3) 1000-1000 MG-UNIT CAPS Take 1,000 mg by mouth.   Past Week at Unknown time  . lisinopril-hydrochlorothiazide (PRINZIDE,ZESTORETIC) 20-12.5 MG tablet Take 1 tablet by mouth daily.   09/28/2018 at Unknown time  . predniSONE (DELTASONE) 10 MG tablet Take 10 mg by mouth daily with breakfast.   Past Week at Unknown time  . ranitidine (ZANTAC) 150 MG tablet Take 150 mg by mouth 2 (two) times daily.   09/29/2018 at Unknown time  . senna (SENOKOT) 8.6 MG TABS tablet Take 1 tablet (8.6 mg total) by mouth daily. 30 each 0 Past Week at Unknown time     Allergies  Allergen Reactions  . Sulfa Antibiotics      Past Medical History:  Diagnosis Date  . Arthritis   . History of kidney stones   . Hypertension     Review  of systems:      Physical Exam    Heart and lungs: Regular rate and rhythm without rub or gallop, lungs are bilaterally clear.    HEENT: Normocephalic atraumatic eyes are anicteric    Other:    Pertinant exam for procedure: Soft nontender nondistended bowel sounds positive normoactive.    Planned proceedures: Colonoscopy and indicated procedures. I have discussed the risks benefits and complications of procedures to include not limited to bleeding, infection, perforation and the risk of sedation and the patient wishes to proceed.    Lollie Sails, MD Gastroenterology 09/29/2018  1:20 PM

## 2018-09-30 ENCOUNTER — Encounter: Payer: Self-pay | Admitting: Gastroenterology

## 2018-09-30 NOTE — Anesthesia Postprocedure Evaluation (Signed)
Anesthesia Post Note  Patient: Juan [name]  Procedure(s) Performed: COLONOSCOPY WITH PROPOFOL (N/A )  Patient location during evaluation: Endoscopy Anesthesia Type: General Level of consciousness: awake and alert and oriented Pain management: pain level controlled Vital Signs Assessment: post-procedure vital signs reviewed and stable Respiratory status: spontaneous breathing, nonlabored ventilation and respiratory function stable Cardiovascular status: blood pressure returned to baseline and stable Postop Assessment: no signs of nausea or vomiting Anesthetic complications: no     Last Vitals:  Vitals:   09/29/18 1432 09/29/18 1442  BP: 119/69   Pulse: (!) 57 (!) 47  Resp: 16 15  Temp:    SpO2: 100% 99%    Last Pain:  Vitals:   09/29/18 1442  TempSrc:   PainSc: 0-No pain                 Shanea Karney

## 2018-10-01 LAB — SURGICAL PATHOLOGY

## 2019-04-12 ENCOUNTER — Other Ambulatory Visit: Payer: Self-pay

## 2019-04-12 ENCOUNTER — Other Ambulatory Visit
Admission: RE | Admit: 2019-04-12 | Discharge: 2019-04-12 | Disposition: A | Discharge status: Home / Self Care | Payer: Medicare HMO | Source: Ambulatory Visit | Attending: Gastroenterology | Admitting: Gastroenterology

## 2019-04-12 DIAGNOSIS — Z01812 Encounter for preprocedural laboratory examination: Secondary | ICD-10-CM | POA: Diagnosis not present

## 2019-04-12 DIAGNOSIS — K5790 Diverticulosis of intestine, part unspecified, without perforation or abscess without bleeding: Secondary | ICD-10-CM | POA: Insufficient documentation

## 2019-04-12 DIAGNOSIS — K648 Other hemorrhoids: Secondary | ICD-10-CM | POA: Diagnosis not present

## 2019-04-12 DIAGNOSIS — Z20828 Contact with and (suspected) exposure to other viral communicable diseases: Secondary | ICD-10-CM | POA: Diagnosis not present

## 2019-04-12 DIAGNOSIS — K635 Polyp of colon: Secondary | ICD-10-CM | POA: Diagnosis not present

## 2019-04-12 LAB — SARS CORONAVIRUS 2 (TAT 6-24 HRS): SARS Coronavirus 2: NEGATIVE

## 2019-04-14 ENCOUNTER — Encounter: Payer: Self-pay | Admitting: *Deleted

## 2019-04-15 ENCOUNTER — Ambulatory Visit
Admission: RE | Admit: 2019-04-15 | Discharge: 2019-04-15 | Disposition: A | Discharge status: Home / Self Care | Payer: Medicare HMO | Source: Ambulatory Visit | Attending: Gastroenterology | Admitting: Gastroenterology

## 2019-04-15 ENCOUNTER — Ambulatory Visit: Payer: Medicare HMO | Admitting: Anesthesiology

## 2019-04-15 ENCOUNTER — Encounter
Admission: RE | Disposition: A | Discharge status: Home / Self Care | Payer: Self-pay | Source: Ambulatory Visit | Attending: Gastroenterology

## 2019-04-15 DIAGNOSIS — Z86010 Personal history of colonic polyps: Secondary | ICD-10-CM | POA: Diagnosis not present

## 2019-04-15 DIAGNOSIS — Z882 Allergy status to sulfonamides status: Secondary | ICD-10-CM | POA: Insufficient documentation

## 2019-04-15 DIAGNOSIS — K640 First degree hemorrhoids: Secondary | ICD-10-CM | POA: Diagnosis not present

## 2019-04-15 DIAGNOSIS — K635 Polyp of colon: Secondary | ICD-10-CM | POA: Insufficient documentation

## 2019-04-15 DIAGNOSIS — Z7952 Long term (current) use of systemic steroids: Secondary | ICD-10-CM | POA: Insufficient documentation

## 2019-04-15 DIAGNOSIS — M1990 Unspecified osteoarthritis, unspecified site: Secondary | ICD-10-CM | POA: Insufficient documentation

## 2019-04-15 DIAGNOSIS — D124 Benign neoplasm of descending colon: Secondary | ICD-10-CM | POA: Diagnosis not present

## 2019-04-15 DIAGNOSIS — I10 Essential (primary) hypertension: Secondary | ICD-10-CM | POA: Insufficient documentation

## 2019-04-15 DIAGNOSIS — Z79899 Other long term (current) drug therapy: Secondary | ICD-10-CM | POA: Diagnosis not present

## 2019-04-15 DIAGNOSIS — K5730 Diverticulosis of large intestine without perforation or abscess without bleeding: Secondary | ICD-10-CM | POA: Diagnosis not present

## 2019-04-15 HISTORY — PX: COLONOSCOPY WITH PROPOFOL: SHX5780

## 2019-04-15 HISTORY — DX: Unspecified cataract: H26.9

## 2019-04-15 HISTORY — DX: Unspecified hearing loss, unspecified ear: H91.90

## 2019-04-15 SURGERY — COLONOSCOPY WITH PROPOFOL
Anesthesia: General

## 2019-04-15 MED ORDER — LIDOCAINE HCL (CARDIAC) PF 100 MG/5ML IV SOSY
PREFILLED_SYRINGE | INTRAVENOUS | Status: DC | PRN
  Administered 2019-04-15: 40 mg via INTRAVENOUS

## 2019-04-15 MED ORDER — PROPOFOL 500 MG/50ML IV EMUL
INTRAVENOUS | Status: DC | PRN
  Administered 2019-04-15: 50 ug/kg/min via INTRAVENOUS

## 2019-04-15 MED ORDER — SODIUM CHLORIDE 0.9 % IV SOLN
INTRAVENOUS | Status: DC
  Administered 2019-04-15: 09:00:00 via INTRAVENOUS

## 2019-04-15 MED ORDER — PROPOFOL 10 MG/ML IV BOLUS
INTRAVENOUS | Status: DC | PRN
  Administered 2019-04-15: 50 mg via INTRAVENOUS
  Administered 2019-04-15: 10 mg via INTRAVENOUS
  Administered 2019-04-15: 20 mg via INTRAVENOUS
  Administered 2019-04-15 (×2): 10 mg via INTRAVENOUS
  Administered 2019-04-15: 20 mg via INTRAVENOUS

## 2019-04-15 MED ORDER — PROPOFOL 500 MG/50ML IV EMUL
INTRAVENOUS | Status: AC
  Filled 2019-04-15: qty 50

## 2019-04-15 MED ORDER — LIDOCAINE HCL (PF) 2 % IJ SOLN
INTRAMUSCULAR | Status: AC
  Filled 2019-04-15: qty 10

## 2019-04-15 NOTE — Anesthesia Post-op Follow-up Note (Signed)
Anesthesia QCDR form completed.        

## 2019-04-15 NOTE — H&P (Signed)
Outpatient short stay form Pre-procedure 04/15/2019 8:32 AM Juan Sails MD  Primary Physician: Dr. Juluis Pitch  Reason for visit: Colonoscopy  History of present illness: Patient is a 78 year old male presenting today for colonoscopy in regards to personal history of adenomatous colon polyps.  He had a colonoscopy done on 09/29/2018 however the prep was poor and could not be completed.  There was one polyp removed that was an adenoma at that time.  Also had a history of some rectal bleeding.  He tolerated his prep well this time and stated that he is clear.   No current facility-administered medications for this encounter.   Medications Prior to Admission  Medication Sig Dispense Refill Last Dose  . Cyanocobalamin (VITAMIN B-12 IJ) Inject 1,000 mcg as directed every 30 (thirty) days.   04/10/2019  . fluticasone (FLONASE) 50 MCG/ACT nasal spray Place into both nostrils daily.   04/10/2019  . atorvastatin (LIPITOR) 20 MG tablet Take 20 mg by mouth daily.   04/10/2019  . cyclobenzaprine (FLEXERIL) 5 MG tablet Take 5 mg by mouth 3 (three) times daily as needed for muscle spasms.   04/10/2019  . Fish Oil-Cholecalciferol (OMEGA-3 FISH OIL/VITAMIN D3) 1000-1000 MG-UNIT CAPS Take 1,000 mg by mouth.   04/10/2019  . lisinopril-hydrochlorothiazide (PRINZIDE,ZESTORETIC) 20-12.5 MG tablet Take 1 tablet by mouth daily.   04/10/2019  . predniSONE (DELTASONE) 10 MG tablet Take 10 mg by mouth daily with breakfast.   04/10/2019  . ranitidine (ZANTAC) 150 MG tablet Take 150 mg by mouth 2 (two) times daily.   04/10/2019  . senna (SENOKOT) 8.6 MG TABS tablet Take 1 tablet (8.6 mg total) by mouth daily. 30 each 0 04/10/2019     Allergies  Allergen Reactions  . Sulfa Antibiotics      Past Medical History:  Diagnosis Date  . Arthritis   . Cataracts, both eyes   . Hearing loss    left ear  . History of kidney stones   . Hypertension     Review of systems:      Physical Exam    Heart and lungs:  Regular rate and rhythm without rub or gallop lungs are bilaterally clear    HEENT: Normocephalic atraumatic eyes are anicteric    Other:    Pertinant exam for procedure: Soft nontender nondistended bowel sounds positive normoactive    Planned proceedures: Colonoscopy and indicated procedures. I have discussed the risks benefits and complications of procedures to include not limited to bleeding, infection, perforation and the risk of sedation and the patient wishes to proceed.    Juan Sails, MD Gastroenterology 04/15/2019  8:32 AM

## 2019-04-15 NOTE — Transfer of Care (Signed)
Immediate Anesthesia Transfer of Care Note  Patient: Juan [name]  Procedure(s) Performed: COLONOSCOPY WITH PROPOFOL (N/A )  Patient Location: PACU  Anesthesia Type:General  Level of Consciousness: sedated  Airway & Oxygen Therapy: Patient Spontanous Breathing  Post-op Assessment: Report given to RN and Post -op Vital signs reviewed and stable  Post vital signs: Reviewed and stable  Last Vitals:  Vitals Value Taken Time  BP 127/65 04/15/19 0922  Temp 36.1 C 04/15/19 0922  Pulse 58 04/15/19 0924  Resp 15 04/15/19 0924  SpO2 97 % 04/15/19 0924  Vitals shown include unvalidated device data.  Last Pain:  Vitals:   04/15/19 0922  TempSrc: Tympanic  PainSc:          Complications: No apparent anesthesia complications

## 2019-04-15 NOTE — Op Note (Signed)
[facility] Patient Name: Juan [name] Procedure Date: 04/15/2019 8:13 AM MRN: [id number] Account #: [id number] Date of Birth: [birth year] Admit Type: Outpatient Age: 78 Room: [facility] ENDO ROOM 3 Gender: Male Note Status: Finalized Procedure:            Colonoscopy Indications:          Personal history of colonic polyps Providers:            Lollie Sails, MD Medicines:            Monitored Anesthesia Care Complications:        No immediate complications. Procedure:            Pre-Anesthesia Assessment:                       - ASA Grade Assessment: II - A patient with mild                        systemic disease.                       After obtaining informed consent, the colonoscope was                        passed under direct vision. Throughout the procedure,                        the patient's blood pressure, pulse, and oxygen                        saturations were monitored continuously. The                        Colonoscope was introduced through the anus and                        advanced to the the cecum, identified by appendiceal                        orifice and ileocecal valve. The colonoscopy was                        performed without difficulty. The patient tolerated the                        procedure well. The quality of the bowel preparation                        was good. Findings:      A 11 mm polyp was found in the descending colon. The polyp was       semi-pedunculated. The polyp was removed with a cold snare. Resection       and retrieval were complete.      A 2 mm polyp was found in the splenic flexure. The polyp was sessile.       The polyp was removed with a cold biopsy forceps. Resection and       retrieval were complete.      Many small and large-mouthed diverticula were found in the sigmoid colon       and descending colon.      Non-bleeding internal hemorrhoids were found during retroflexion  and   during anoscopy. The hemorrhoids were small and Grade I (internal       hemorrhoids that do not prolapse).      No additional abnormalities were found on retroflexion.      The exam was otherwise without abnormality. Impression:           - One 11 mm polyp in the descending colon, removed with                        a cold snare. Resected and retrieved.                       - One 2 mm polyp at the splenic flexure, removed with a                        cold biopsy forceps. Resected and retrieved.                       - Diverticulosis in the sigmoid colon and in the                        descending colon.                       - Non-bleeding internal hemorrhoids.                       - The examination was otherwise normal. Recommendation:       - Discharge patient to home.                       - Await pathology results.                       - Telephone GI clinic for pathology results in 5 days. Procedure Code(s):    --- Professional ---                       [phone], Colonoscopy, flexible; with removal of tumor(s),                        polyp(s), or other lesion(s) by snare technique                       [id number], 70, Colonoscopy, flexible; with biopsy, single                        or multiple Diagnosis Code(s):    --- Professional ---                       K63.5, Polyp of colon                       K64.0, First degree hemorrhoids                       Z86.010, Personal history of colonic polyps                       K57.30, Diverticulosis of large intestine without  perforation or abscess without bleeding CPT copyright 2019 [employer]. All rights reserved. The codes documented in this report are preliminary and upon coder review may  be revised to meet current compliance requirements. Lollie Sails, MD 04/15/2019 9:21:30 AM This report has been signed electronically. Number of Addenda: 0 Note Initiated On: 04/15/2019 8:13 AM Scope  Withdrawal Time: 0 hours 10 minutes 47 seconds  Total Procedure Duration: 0 hours 23 minutes 13 seconds       [facility]

## 2019-04-15 NOTE — Anesthesia Preprocedure Evaluation (Signed)
Anesthesia Evaluation  Patient identified by MRN, date of birth, ID band Patient awake    Reviewed: Allergy & Precautions, NPO status , Patient's Chart, lab work & pertinent test results, reviewed documented beta blocker date and time   Airway Mallampati: II  TM Distance: >3 FB     Dental  (+) Chipped   Pulmonary           Cardiovascular hypertension, Pt. on medications      Neuro/Psych    GI/Hepatic   Endo/Other    Renal/GU      Musculoskeletal  (+) Arthritis ,   Abdominal   Peds  Hematology   Anesthesia Other Findings   Reproductive/Obstetrics                             Anesthesia Physical Anesthesia Plan  ASA: II  Anesthesia Plan: General   Post-op Pain Management:    Induction: Intravenous  PONV Risk Score and Plan:   Airway Management Planned:   Additional Equipment:   Intra-op Plan:   Post-operative Plan:   Informed Consent: I have reviewed the patients History and Physical, chart, labs and discussed the procedure including the risks, benefits and alternatives for the proposed anesthesia with the patient or authorized representative who has indicated his/her understanding and acceptance.       Plan Discussed with: CRNA  Anesthesia Plan Comments:         Anesthesia Quick Evaluation

## 2019-04-15 NOTE — Anesthesia Postprocedure Evaluation (Signed)
Anesthesia Post Note  Patient: Juan [name]  Procedure(s) Performed: COLONOSCOPY WITH PROPOFOL (N/A )  Patient location during evaluation: Endoscopy Anesthesia Type: General Level of consciousness: awake and alert Pain management: pain level controlled Vital Signs Assessment: post-procedure vital signs reviewed and stable Respiratory status: spontaneous breathing, nonlabored ventilation, respiratory function stable and patient connected to nasal cannula oxygen Cardiovascular status: blood pressure returned to baseline and stable Postop Assessment: no apparent nausea or vomiting Anesthetic complications: no     Last Vitals:  Vitals:   04/15/19 0922 04/15/19 0950  BP: 127/65 (!) 165/79  Pulse: 61 (!) 51  Resp: 16 17  Temp: (!) 36.1 C   SpO2: 100% 100%    Last Pain:  Vitals:   04/15/19 0922  TempSrc: Tympanic  PainSc:                  Fayette Gasner S

## 2019-04-16 ENCOUNTER — Encounter: Payer: Self-pay | Admitting: Gastroenterology

## 2019-04-16 LAB — SURGICAL PATHOLOGY

## 2019-05-19 ENCOUNTER — Ambulatory Visit (INDEPENDENT_AMBULATORY_CARE_PROVIDER_SITE_OTHER): Payer: Medicare HMO | Admitting: Podiatry

## 2019-05-19 ENCOUNTER — Ambulatory Visit: Payer: Self-pay

## 2019-05-19 ENCOUNTER — Encounter: Payer: Self-pay | Admitting: Podiatry

## 2019-05-19 ENCOUNTER — Other Ambulatory Visit: Payer: Self-pay

## 2019-05-19 DIAGNOSIS — G5782 Other specified mononeuropathies of left lower limb: Secondary | ICD-10-CM

## 2019-05-19 DIAGNOSIS — M778 Other enthesopathies, not elsewhere classified: Secondary | ICD-10-CM

## 2019-05-19 DIAGNOSIS — G5762 Lesion of plantar nerve, left lower limb: Secondary | ICD-10-CM | POA: Diagnosis not present

## 2019-05-19 NOTE — Progress Notes (Signed)
Subjective:  Patient ID: Juan [name], male    DOB: [birth year],  MRN: [id number] HPI Chief Complaint  Patient presents with  . Foot Pain    Patient presents today for left foot forefoot pain, burning x 2-3 months.  He states "my last 3 toes feel like they are on fire when I walk with my shoes on after about an hour"  He has been using Dr. Ballard Russell foot cream and soaking in epson salt with slight relief    78 y.o. male presents with the above complaint.   ROS: Denies fever chills nausea vomiting muscle aches pains calf pain back pain chest pain shortness of breath.  Past Medical History:  Diagnosis Date  . Arthritis   . Cataracts, both eyes   . Hearing loss    left ear  . History of kidney stones   . Hypertension    Past Surgical History:  Procedure Laterality Date  . APPENDECTOMY    . COLONOSCOPY WITH PROPOFOL N/A 09/29/2018   Procedure: COLONOSCOPY WITH PROPOFOL;  Surgeon: Lollie Sails, MD;  Location: [facility] ENDOSCOPY;  Service: Endoscopy;  Laterality: N/A;  . COLONOSCOPY WITH PROPOFOL N/A 04/15/2019   Procedure: COLONOSCOPY WITH PROPOFOL;  Surgeon: Lollie Sails, MD;  Location: [facility] ENDOSCOPY;  Service: Endoscopy;  Laterality: N/A;    Current Outpatient Medications:  .  Cyanocobalamin (VITAMIN B-12 IJ), Inject 1,000 mcg as directed every 30 (thirty) days., Disp: , Rfl:  .  cyclobenzaprine (FLEXERIL) 5 MG tablet, Take 5 mg by mouth 3 (three) times daily as needed for muscle spasms., Disp: , Rfl:  .  Fish Oil-Cholecalciferol (OMEGA-3 FISH OIL/VITAMIN D3) 1000-1000 MG-UNIT CAPS, Take 1,000 mg by mouth., Disp: , Rfl:  .  fluticasone (FLONASE) 50 MCG/ACT nasal spray, Place into both nostrils daily., Disp: , Rfl:  .  lisinopril-hydrochlorothiazide (PRINZIDE,ZESTORETIC) 20-12.5 MG tablet, Take 1 tablet by mouth daily., Disp: , Rfl:  .  senna (SENOKOT) 8.6 MG TABS tablet, Take 1 tablet (8.6 mg total) by mouth daily., Disp: 30 each, Rfl: 0  Allergies  Allergen Reactions  .  Sulfa Antibiotics    Review of Systems Objective:  There were no vitals filed for this visit.  General: Well developed, nourished, in no acute distress, alert and oriented x3   Dermatological: Skin is warm, dry and supple bilateral. Nails x 10 are well maintained; remaining integument appears unremarkable at this time. There are no open sores, no preulcerative lesions, no rash or signs of infection present.  Vascular: Dorsalis Pedis artery and Posterior Tibial artery pedal pulses are 2/4 bilateral with immedate capillary fill time. Pedal hair growth present. No varicosities and no lower extremity edema present bilateral.   Neruologic: Grossly intact via light touch bilateral. Vibratory intact via tuning fork bilateral. Protective threshold with Semmes Wienstein monofilament intact to all pedal sites bilateral. Patellar and Achilles deep tendon reflexes 2+ bilateral. No Babinski or clonus noted bilateral.  He has pain on palpation to the third interdigital space of the left foot.  Palpable Mulder's click noted left.  Musculoskeletal: No gross boney pedal deformities bilateral. No pain, crepitus, or limitation noted with foot and ankle range of motion bilateral. Muscular strength 5/5 in all groups tested bilateral.  Gait: Unassisted, Nonantalgic.    Radiographs:  None taken  Assessment & Plan:   Assessment: Neuroma third interdigital space left foot.  Plan: Discussed etiology pathology conservative versus surgical therapies.  At this point I injected the third interdigital space with 10 mg of Kenalog 5  mg of Marcaine point of maximal tenderness of the left third interdigital space.  Follow-up with him in 1 month     Max T. [place], [place]

## 2019-05-19 NOTE — Addendum Note (Signed)
Addended by: Rip Harbour on: 05/19/2019 09:08 AM   Modules accepted: Orders

## 2019-06-01 ENCOUNTER — Ambulatory Visit: Payer: Self-pay | Admitting: Surgery

## 2019-06-01 NOTE — H&P (Signed)
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 male who was referrred by Carolynn Serve, * for above. Symptoms were first noted a few years ago. he has some rectal bleeding occurring several times before. Bleeding is described as just notes blood on tissue paper. No pain.  No discharge.  No other associated symptoms.  Instructed to be seen by surgeon by GI provider from recent colonoscopy.  He reports the endoscopist specifically told him his "hemorrhoids" can turn into cancer.    Past Medical History:  has a past medical history of Arthritis, Cataracts, both eyes, Hearing loss, Hypertension, and Kidney stones.  Past Surgical History:  has a past surgical history that includes Appendectomy; Colonoscopy (09/29/2018); and Colonoscopy (04/15/2019).  Family History: family history includes High blood pressure (Hypertension) in his father.  Social History:  reports that he has never smoked. He has never used smokeless tobacco. He reports that he does not drink alcohol or use drugs.  Current Medications: has a current medication list which includes the following prescription(s): fexofenadine, fluticasone propionate, lisinopril-hydrochlorothiazide, and omega-3 fatty acids/fish oil, and the following Facility-Administered Medications: cyanocobalamin.  Allergies:       Allergies as of 06/01/2019 - Reviewed 06/01/2019  Allergen Reaction Noted  . Sulfa (sulfonamide antibiotics) Unknown 05/03/2015    ROS:  A 15 point review of systems was performed and pertinent positives and negatives noted in HPI  Objective:   BP 136/77   Pulse 80   Ht 162.6 cm (5\' 4" )   Wt 65.3 kg (144 lb)   BMI 24.72 kg/m   Constitutional :  alert, appears stated age, cooperative and no distress hard of hearing  Lymphatics/Throat::  no asymmetry, masses, or scars  Respiratory:  clear to auscultation bilaterally  Cardiovascular:  regular rate and rhythm  Gastrointestinal:  soft, non-tender; bowel sounds normal; no masses,  no organomegaly.    Musculoskeletal: Steady gait and movement  Skin: Cool and moist  Psychiatric: Normal affect, non-agitated, not confused  Genital/Rectal: External exam noted to have anal skin tags, non tender, no discharge.  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  DRE with no obvious mass, no gross blood.  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  Alternatives include the options of observation, medical management.  Benefits include curative intent.  I discussed  in detail and the complications related to the operation and the anesthesia, including bleeding, infection, recurrence, remote possibility of temporary or permanent fecal incontinence, poor/delayed wound healing, chronic pain, and additional procedures to address said risks. The risks of general anesthetic, if used, includes MI, CVA, sudden death or even reaction to anesthetic medications also discussed.    The patient understands the risks, any and all questions were answered to the patient's satisfaction.  2. Patient has elected to proceed with surgical treatment. Procedure will be scheduled.  Written consent was obtained.    Electronically signed by Benjamine Sprague, DO on 06/01/2019 2:48 PM

## 2019-06-01 NOTE — H&P (View-Only) (Signed)
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 male who was referrred by Carolynn Serve, * for above. Symptoms were first noted a few years ago. he has some rectal bleeding occurring several times before. Bleeding is described as just notes blood on tissue paper. No pain.  No discharge.  No other associated symptoms.  Instructed to be seen by surgeon by GI provider from recent colonoscopy.  He reports the endoscopist specifically told him his "hemorrhoids" can turn into cancer.    Past Medical History:  has a past medical history of Arthritis, Cataracts, both eyes, Hearing loss, Hypertension, and Kidney stones.  Past Surgical History:  has a past surgical history that includes Appendectomy; Colonoscopy (09/29/2018); and Colonoscopy (04/15/2019).  Family History: family history includes High blood pressure (Hypertension) in his father.  Social History:  reports that he has never smoked. He has never used smokeless tobacco. He reports that he does not drink alcohol or use drugs.  Current Medications: has a current medication list which includes the following prescription(s): fexofenadine, fluticasone propionate, lisinopril-hydrochlorothiazide, and omega-3 fatty acids/fish oil, and the following Facility-Administered Medications: cyanocobalamin.  Allergies:       Allergies as of 06/01/2019 - Reviewed 06/01/2019  Allergen Reaction Noted  . Sulfa (sulfonamide antibiotics) Unknown 05/03/2015    ROS:  A 15 point review of systems was performed and pertinent positives and negatives noted in HPI  Objective:   BP 136/77   Pulse 80   Ht 162.6 cm (5\' 4" )   Wt 65.3 kg (144 lb)   BMI 24.72 kg/m   Constitutional :  alert, appears stated age, cooperative and no distress hard of hearing  Lymphatics/Throat::  no asymmetry, masses, or scars  Respiratory:  clear to auscultation bilaterally  Cardiovascular:  regular rate and rhythm  Gastrointestinal:  soft, non-tender; bowel sounds normal; no masses,  no organomegaly.    Musculoskeletal: Steady gait and movement  Skin: Cool and moist  Psychiatric: Normal affect, non-agitated, not confused  Genital/Rectal: External exam noted to have anal skin tags, non tender, no discharge.  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  DRE with no obvious mass, no gross blood.  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  Alternatives include the options of observation, medical management.  Benefits include curative intent.  I discussed  in detail and the complications related to the operation and the anesthesia, including bleeding, infection, recurrence, remote possibility of temporary or permanent fecal incontinence, poor/delayed wound healing, chronic pain, and additional procedures to address said risks. The risks of general anesthetic, if used, includes MI, CVA, sudden death or even reaction to anesthetic medications also discussed.    The patient understands the risks, any and all questions were answered to the patient's satisfaction.  2. Patient has elected to proceed with surgical treatment. Procedure will be scheduled.  Written consent was obtained.    Electronically signed by Benjamine Sprague, DO on 06/01/2019 2:48 PM

## 2019-06-04 ENCOUNTER — Other Ambulatory Visit: Admission: RE | Admit: 2019-06-04 | Payer: Medicare HMO | Source: Ambulatory Visit

## 2019-06-07 ENCOUNTER — Other Ambulatory Visit: Payer: Self-pay

## 2019-06-07 ENCOUNTER — Other Ambulatory Visit
Admission: RE | Admit: 2019-06-07 | Discharge: 2019-06-07 | Disposition: A | Discharge status: Home / Self Care | Payer: Medicare HMO | Source: Ambulatory Visit | Attending: Surgery | Admitting: Surgery

## 2019-06-07 DIAGNOSIS — Z20828 Contact with and (suspected) exposure to other viral communicable diseases: Secondary | ICD-10-CM | POA: Insufficient documentation

## 2019-06-07 DIAGNOSIS — Z01812 Encounter for preprocedural laboratory examination: Secondary | ICD-10-CM | POA: Insufficient documentation

## 2019-06-07 LAB — SARS CORONAVIRUS 2 (TAT 6-24 HRS): SARS Coronavirus 2: NEGATIVE

## 2019-06-08 ENCOUNTER — Encounter
Admission: RE | Admit: 2019-06-08 | Discharge: 2019-06-08 | Disposition: A | Discharge status: Home / Self Care | Payer: Medicare HMO | Source: Ambulatory Visit | Attending: Surgery | Admitting: Surgery

## 2019-06-08 ENCOUNTER — Other Ambulatory Visit: Payer: Self-pay

## 2019-06-08 DIAGNOSIS — I10 Essential (primary) hypertension: Secondary | ICD-10-CM | POA: Diagnosis not present

## 2019-06-08 DIAGNOSIS — A630 Anogenital (venereal) warts: Secondary | ICD-10-CM | POA: Diagnosis not present

## 2019-06-08 DIAGNOSIS — Z01810 Encounter for preprocedural cardiovascular examination: Secondary | ICD-10-CM | POA: Insufficient documentation

## 2019-06-08 DIAGNOSIS — I498 Other specified cardiac arrhythmias: Secondary | ICD-10-CM | POA: Insufficient documentation

## 2019-06-08 NOTE — Patient Instructions (Signed)
  Your procedure is scheduled on: Thursday June 10, 2019 Report to Same Day Surgery 2nd floor [facility] Entrance-take elevator on left to 2nd floor.  Check in with surgery information desk.) To find out your arrival time, call [phone] 1:00-3:00 PM on Wednesday June 09, 2019  Remember: Instructions that are not followed completely may result in serious medical risk, up to and including death, or upon the discretion of your surgeon and anesthesiologist your surgery may need to be rescheduled.    __x__ 1. Do not eat food (including mints, candies, chewing gum) after midnight the night before your procedure. You may drink water up to 2 hours before you are scheduled to arrive at the hospital for your procedure.  Do not drink anything within 2 hours of your scheduled arrival to the hospital.    __x__ 2. No Alcohol for 24 hours before or after surgery.   __x__ 3. No Smoking or e-cigarettes for 24 hours before surgery.  Do not use any chewable tobacco products for at least 6 hours before surgery.   __x__ 4. Notify your doctor if there is any change in your medical condition (cold, fever, infections).   __x__ 5. On the morning of surgery brush your teeth with toothpaste and water.  You may rinse your mouth with mouthwash if you wish.  Do not swallow any toothpaste or mouthwash.  Please read over the following fact sheets that you were given:   [facility] Preparing for Surgery and/or MRSA Information    __x__ Use CHG Soap as directed on instruction sheet.   Do not wear jewelry on the day of surgery.  Do not wear lotions, powders, deodorant, or perfumes.   Do not shave below the face/neck 48 hours prior to surgery.   Do not bring valuables to the hospital.    [facility] is not responsible for any belongings or valuables.    For patients admitted to the hospital, discharge time is determined by your treatment team.  For patients discharged on the day of surgery, you  will NOT be permitted to drive yourself home.  You must have a responsible adult with you for 24 hours after surgery.  __x__ TAKE NO MEDICINE ON THE MORNING OF SURGERY.  __x__ Follow recommendations from Cardiologist, Pulmonologist or PCP regarding stopping Aspirin, Coumadin, Plavix, Eliquis, Effient, Pradaxa, and Pletal.  __x__ TODAY: Stop Anti-inflammatories such as Advil, Ibuprofen, Motrin, Aleve, Naproxen, Naprosyn, BC/Goodies powders or aspirin products. You may continue to take Tylenol and Celebrex.   __x__ TODAY: Stop supplements until after surgery. You may continue to take Vitamin D, Vitamin B, and multivitamin.

## 2019-06-10 ENCOUNTER — Other Ambulatory Visit: Payer: Self-pay

## 2019-06-10 ENCOUNTER — Ambulatory Visit: Payer: Medicare HMO | Admitting: Anesthesiology

## 2019-06-10 ENCOUNTER — Encounter
Admission: RE | Disposition: A | Discharge status: Home / Self Care | Payer: Self-pay | Source: Home / Self Care | Attending: Surgery

## 2019-06-10 ENCOUNTER — Encounter: Payer: Self-pay | Admitting: *Deleted

## 2019-06-10 ENCOUNTER — Ambulatory Visit
Admission: RE | Admit: 2019-06-10 | Discharge: 2019-06-10 | Disposition: A | Discharge status: Home / Self Care | Payer: Medicare HMO | Attending: Surgery | Admitting: Surgery

## 2019-06-10 DIAGNOSIS — A630 Anogenital (venereal) warts: Secondary | ICD-10-CM | POA: Insufficient documentation

## 2019-06-10 DIAGNOSIS — I10 Essential (primary) hypertension: Secondary | ICD-10-CM | POA: Insufficient documentation

## 2019-06-10 DIAGNOSIS — K648 Other hemorrhoids: Secondary | ICD-10-CM | POA: Insufficient documentation

## 2019-06-10 DIAGNOSIS — M1990 Unspecified osteoarthritis, unspecified site: Secondary | ICD-10-CM | POA: Diagnosis not present

## 2019-06-10 HISTORY — PX: WART FULGURATION: SHX5245

## 2019-06-10 SURGERY — FULGURATION, WART, ANUS
Anesthesia: General | Site: Buttocks

## 2019-06-10 MED ORDER — DOCUSATE SODIUM 100 MG PO CAPS: 100.0000 mg | ORAL_CAPSULE | Freq: Two times a day (BID) | ORAL | 0 refills | Status: AC | PRN

## 2019-06-10 MED ORDER — PROMETHAZINE HCL 25 MG/ML IJ SOLN: 6.2500 mg | INTRAMUSCULAR | Status: DC | PRN

## 2019-06-10 MED ORDER — FAMOTIDINE 20 MG PO TABS
20.0000 mg | ORAL_TABLET | Freq: Once | ORAL | Status: AC
  Administered 2019-06-10: 09:00:00 20 mg via ORAL

## 2019-06-10 MED ORDER — ONDANSETRON HCL 4 MG/2ML IJ SOLN
INTRAMUSCULAR | Status: DC | PRN
  Administered 2019-06-10: 4 mg via INTRAVENOUS

## 2019-06-10 MED ORDER — FENTANYL CITRATE (PF) 100 MCG/2ML IJ SOLN
INTRAMUSCULAR | Status: DC | PRN
  Administered 2019-06-10: 50 ug via INTRAVENOUS

## 2019-06-10 MED ORDER — ACETAMINOPHEN 160 MG/5ML PO SOLN
325.0000 mg | ORAL | Status: DC | PRN
  Filled 2019-06-10: qty 20.3

## 2019-06-10 MED ORDER — MIDAZOLAM HCL 2 MG/2ML IJ SOLN
INTRAMUSCULAR | Status: DC | PRN
  Administered 2019-06-10: 2 mg via INTRAVENOUS

## 2019-06-10 MED ORDER — PROPOFOL 10 MG/ML IV BOLUS
INTRAVENOUS | Status: DC | PRN
  Administered 2019-06-10: 100 mg via INTRAVENOUS

## 2019-06-10 MED ORDER — DEXAMETHASONE SODIUM PHOSPHATE 10 MG/ML IJ SOLN
INTRAMUSCULAR | Status: DC | PRN
  Administered 2019-06-10: 10 mg via INTRAVENOUS

## 2019-06-10 MED ORDER — MIDAZOLAM HCL 2 MG/2ML IJ SOLN
INTRAMUSCULAR | Status: AC
  Filled 2019-06-10: qty 2

## 2019-06-10 MED ORDER — LIDOCAINE HCL (CARDIAC) PF 100 MG/5ML IV SOSY
PREFILLED_SYRINGE | INTRAVENOUS | Status: DC | PRN
  Administered 2019-06-10: 80 mg via INTRAVENOUS

## 2019-06-10 MED ORDER — BUPIVACAINE-EPINEPHRINE 0.5% -1:200000 IJ SOLN
INTRAMUSCULAR | Status: DC | PRN
  Administered 2019-06-10: 7 mL

## 2019-06-10 MED ORDER — IBUPROFEN 800 MG PO TABS: 800.0000 mg | ORAL_TABLET | Freq: Three times a day (TID) | ORAL | 0 refills | Status: DC | PRN

## 2019-06-10 MED ORDER — CHLORHEXIDINE GLUCONATE CLOTH 2 % EX PADS: 6.0000 | MEDICATED_PAD | Freq: Once | CUTANEOUS | Status: DC

## 2019-06-10 MED ORDER — LACTATED RINGERS IV SOLN
INTRAVENOUS | Status: DC
  Administered 2019-06-10: 09:00:00 via INTRAVENOUS

## 2019-06-10 MED ORDER — BUPIVACAINE LIPOSOME 1.3 % IJ SUSP
INTRAMUSCULAR | Status: AC
  Filled 2019-06-10: qty 20

## 2019-06-10 MED ORDER — ACETAMINOPHEN 325 MG PO TABS: 650.0000 mg | ORAL_TABLET | Freq: Three times a day (TID) | ORAL | 0 refills | Status: AC | PRN

## 2019-06-10 MED ORDER — HYDROCODONE-ACETAMINOPHEN 7.5-325 MG PO TABS: 1.0000 | ORAL_TABLET | Freq: Once | ORAL | Status: DC | PRN

## 2019-06-10 MED ORDER — FENTANYL CITRATE (PF) 100 MCG/2ML IJ SOLN
INTRAMUSCULAR | Status: AC
  Filled 2019-06-10: qty 2

## 2019-06-10 MED ORDER — FENTANYL CITRATE (PF) 100 MCG/2ML IJ SOLN: 25.0000 ug | INTRAMUSCULAR | Status: DC | PRN

## 2019-06-10 MED ORDER — FAMOTIDINE 20 MG PO TABS
ORAL_TABLET | ORAL | Status: AC
  Filled 2019-06-10: qty 1

## 2019-06-10 MED ORDER — ACETAMINOPHEN 325 MG PO TABS: 325.0000 mg | ORAL_TABLET | ORAL | Status: DC | PRN

## 2019-06-10 SURGICAL SUPPLY — 32 items
ADAPTER SMOKE EVAC 7/8 TO 3/8 (ADAPTER) ×3 IMPLANT
BLADE SURG 15 STRL LF DISP TIS (BLADE) ×1 IMPLANT
BLADE SURG 15 STRL SS (BLADE) ×3
BRIEF STRETCH MATERNITY 2XLG (MISCELLANEOUS) ×3 IMPLANT
CANISTER SUCT 1200ML W/VALVE (MISCELLANEOUS) ×3 IMPLANT
COVER WAND RF STERILE (DRAPES) ×3 IMPLANT
DRAPE PERI LITHO V/GYN (MISCELLANEOUS) ×3 IMPLANT
DRAPE UNDER BUTTOCK W/FLU (DRAPES) ×3 IMPLANT
DRSG GAUZE FLUFF 36X18 (GAUZE/BANDAGES/DRESSINGS) ×3 IMPLANT
ELECT REM PT RETURN 9FT ADLT (ELECTROSURGICAL) ×3
ELECTRODE REM PT RTRN 9FT ADLT (ELECTROSURGICAL) ×1 IMPLANT
FILTER PURE VAC YLPA (MISCELLANEOUS) ×1 IMPLANT
FLTR PURE VAC YLPA (MISCELLANEOUS) ×3
GLOVE BIOGEL PI IND STRL 7.0 (GLOVE) ×1 IMPLANT
GLOVE BIOGEL PI INDICATOR 7.0 (GLOVE) ×2
GLOVE SURG SYN 6.5 ES PF (GLOVE) ×3 IMPLANT
GOWN STRL REUS W/ TWL LRG LVL3 (GOWN DISPOSABLE) ×2 IMPLANT
GOWN STRL REUS W/TWL LRG LVL3 (GOWN DISPOSABLE) ×6
KIT TURNOVER CYSTO (KITS) ×3 IMPLANT
LABEL OR SOLS (LABEL) ×3 IMPLANT
NEEDLE HYPO 22GX1.5 SAFETY (NEEDLE) ×6 IMPLANT
NS IRRIG 500ML POUR BTL (IV SOLUTION) ×3 IMPLANT
PACK BASIN MINOR ARMC (MISCELLANEOUS) ×3 IMPLANT
PAD PREP 24X41 OB/GYN DISP (PERSONAL CARE ITEMS) ×3 IMPLANT
SOL PREP PVP 2OZ (MISCELLANEOUS) ×3
SOLUTION PREP PVP 2OZ (MISCELLANEOUS) ×1 IMPLANT
SURGILUBE 2OZ TUBE FLIPTOP (MISCELLANEOUS) ×3 IMPLANT
SUT VIC AB 3-0 SH 27 (SUTURE) ×3
SUT VIC AB 3-0 SH 27X BRD (SUTURE) ×1 IMPLANT
SYR 10ML LL (SYRINGE) ×3 IMPLANT
SYR 20ML LL LF (SYRINGE) ×3 IMPLANT
TUBING SMOKE EVAC 6FT (TUBING) ×3 IMPLANT

## 2019-06-10 NOTE — Anesthesia Post-op Follow-up Note (Signed)
Anesthesia QCDR form completed.        

## 2019-06-10 NOTE — Anesthesia Procedure Notes (Signed)
Procedure Name: LMA Insertion Date/Time: 06/10/2019 9:50 AM Performed by: Leander Rams, CRNA Pre-anesthesia Checklist: Patient identified, Emergency Drugs available, Suction available, Patient being monitored and Timeout performed Patient Re-evaluated:Patient Re-evaluated prior to induction Oxygen Delivery Method: Circle system utilized Preoxygenation: Pre-oxygenation with 100% oxygen Induction Type: IV induction LMA: LMA inserted LMA Size: 4.0 Tube secured with: Tape Dental Injury: Teeth and Oropharynx as per pre-operative assessment

## 2019-06-10 NOTE — Interval H&P Note (Signed)
History and Physical Interval Note:  06/10/2019 9:11 AM  Juan [name]  has presented today for surgery, with the diagnosis of A63.0 Anal Warts.  The various methods of treatment have been discussed with the patient and family. After consideration of risks, benefits and other options for treatment, the patient has consented to  Procedure(s): FULGURATION ANAL WART (N/A) as a surgical intervention.  The patient's history has been reviewed, patient examined, no change in status, stable for surgery.  I have reviewed the patient's chart and labs.  Questions were answered to the patient's satisfaction.     Hosanna Betley Lysle Pearl

## 2019-06-10 NOTE — Discharge Instructions (Signed)
AMBULATORY SURGERY  DISCHARGE INSTRUCTIONS   1) The drugs that you were given will stay in your system until tomorrow so for the next 24 hours you should not:  A) Drive an automobile B) Make any legal decisions C) Drink any alcoholic beverage   2) You may resume regular meals tomorrow.  Today it is better to start with liquids and gradually work up to solid foods.  You may eat anything you prefer, but it is better to start with liquids, then soup and crackers, and gradually work up to solid foods.   3) Please notify your doctor immediately if you have any unusual bleeding, trouble breathing, redness and pain at the surgery site, drainage, fever, or pain not relieved by medication.    4) Additional Instructions:        Please contact your physician with any problems or Same Day Surgery at [phone], Monday through Friday 6 am to 4 pm, or [place] at [facility] number at [phone].Anal wart removal, Care After This sheet gives you information about how to care for yourself after your procedure. Your health care provider may also give you more specific instructions. If you have problems or questions, contact your health care provider. What can I expect after the procedure? After the procedure, it is common to have:  Soreness.  Bruising.  Itching. Follow these instructions at home: site care Follow instructions from your health care provider about how to take care of your site. Make sure you:  Wash your hands with soap and water before and after you change your bandage (dressing). If soap and water are not available, use hand sanitizer.  If the area bleeds or bruises, apply gentle pressure for 10 minutes.  OK TO SHOWER IN 24HRS  Check your site every day for signs of infection. Check for:  Redness, swelling, or pain.  Fluid or blood.  Warmth.  Pus or a bad smell.  General instructions  Rest and then return to your normal activities as told by your  health care provider.   tylenol and advil as needed for discomfort.  Please alternate between the two every four hours as needed for pain.      325-650mg  every 8hrs to max of 3000mg /24hrs for the tylenol.     Advil up to 800mg  per dose every 8hrs as needed for pain.    Keep all follow-up visits as told by your health care provider. This is important. Contact a health care provider if:  You have redness, swelling, or pain around your site.  You have fluid or blood coming from your site.  Your site feels warm to the touch.  You have pus or a bad smell coming from your site.  You have a fever.  Your sutures, skin glue, or adhesive strips loosen or come off sooner than expected. Get help right away if:  You have bleeding that does not stop with pressure or a dressing. Summary  After the procedure, it is common to have some soreness, bruising, and itching at the site.  Follow instructions from your health care provider about how to take care of your site.  Check your site every day for signs of infection.  Contact a health care provider if you have redness, swelling, or pain around your site, or your site feels warm to the touch.  Keep all follow-up visits as told by your health care provider. This is important. This information is not intended to replace advice given to you by your  health care provider. Make sure you discuss any questions you have with your health care provider. Document Released: 09/01/2015 Document Revised: 02/02/2018 Document Reviewed: 02/02/2018 Elsevier Interactive Patient Education  [employer].

## 2019-06-10 NOTE — Anesthesia Preprocedure Evaluation (Addendum)
Anesthesia Evaluation  Patient identified by MRN, date of birth, ID band Patient awake    Reviewed: Allergy & Precautions, H&P , NPO status , reviewed documented beta blocker date and time   Airway Mallampati: II  TM Distance: >3 FB     Dental  (+) Chipped   Pulmonary    Pulmonary exam normal        Cardiovascular hypertension, Normal cardiovascular exam     Neuro/Psych    GI/Hepatic neg GERD  ,  Endo/Other    Renal/GU      Musculoskeletal  (+) Arthritis ,   Abdominal   Peds  Hematology   Anesthesia Other Findings Past Medical History: No date: Arthritis No date: Cataracts, both eyes No date: Hearing loss     Comment:  left ear No date: History of kidney stones No date: Hypertension Past Surgical History: No date: APPENDECTOMY 09/29/2018: COLONOSCOPY WITH PROPOFOL; N/A     Comment:  Procedure: COLONOSCOPY WITH PROPOFOL;  Surgeon:               Lollie Sails, MD;  Location: [facility] ENDOSCOPY;                Service: Endoscopy;  Laterality: N/A; 04/15/2019: COLONOSCOPY WITH PROPOFOL; N/A     Comment:  Procedure: COLONOSCOPY WITH PROPOFOL;  Surgeon:               Lollie Sails, MD;  Location: [facility] ENDOSCOPY;                Service: Endoscopy;  Laterality: N/A;   Reproductive/Obstetrics                          Anesthesia Physical Anesthesia Plan  ASA: II  Anesthesia Plan: General   Post-op Pain Management:    Induction: Intravenous  PONV Risk Score and Plan: 2 and Ondansetron, Treatment may vary due to age or medical condition, Midazolam and Dexamethasone  Airway Management Planned: LMA  Additional Equipment:   Intra-op Plan:   Post-operative Plan: Extubation in OR  Informed Consent: I have reviewed the patients History and Physical, chart, labs and discussed the procedure including the risks, benefits and alternatives for the proposed anesthesia with the patient or  authorized representative who has indicated his/her understanding and acceptance.     Dental Advisory Given  Plan Discussed with: CRNA  Anesthesia Plan Comments:        Anesthesia Quick Evaluation

## 2019-06-10 NOTE — Op Note (Signed)
Preoperative diagnosis: Anal condyloma  Postoperative diagnosis: Same plus internal hemorrhoids  Procedure: Exam under anesthesia, excisional biopsy of anal lesion, fulguration of anal warts  Anesthesia: LMA  Surgeon: Lysle Pearl  Wound Classification: Clean Contaminated  Specimen: None  Complications: None  EBL: 3 mL  Indications:  Patient is a 78 y.o. male with clinical exam concerning for above.  Here today for formal exam under anesthesia.  See H&P for further details.  Description of procedure: The patient was taken to the operating room and placed in high lithotomy position.  LMA anesthesia was induced without any difficulty. A time-out was completed verifying correct patient, procedure, site, positioning, and implant(s) and/or special equipment prior to beginning this procedure.  External exam consistent with previously seen papular lesions most consistent with anal condyloma.  Patient is also developed extensive internal hemorrhoids circumferentially that are not easily visible from the outside.  Digital rectal exam noted no obvious mass or ulcerations.  Additional papillary lesions consistent with warts was noted on top of the visible internal hemorrhoids.  Small cluster of anal warts were noted in the 4 o'clock position.  Local anesthetic infused underneath this area and a excisional biopsy was performed by creating an elliptical incision surrounding these lesions.  This was sent off operative field pending pathology.  Wound hemostasis was then achieved electrocautery and closed primarily with 3-0 Vicryl in an interrupted fashion subcutaneously.  Remaining similar papillary lesions throughout the entire perianal annulus and extensively cauterized, including the lesions that are visible atop the internal hemorrhoidal vessels.  Local anesthesia was infused deep to these lesions prior to fulguration.  After confirming fulguration of all visible lesions and no additional pathology, and  procedure was completed.  The patient tolerated the procedure well and  taken to the postoperative care unit in stable condition.  Sponge and instrument count was correct at the end of the procedure.

## 2019-06-10 NOTE — Transfer of Care (Signed)
Immediate Anesthesia Transfer of Care Note  Patient: Juan [name]  Procedure(s) Performed: FULGURATION ANAL WART (N/A Buttocks)  Patient Location: PACU  Anesthesia Type:General  Level of Consciousness: awake  Airway & Oxygen Therapy: Patient Spontanous Breathing  Post-op Assessment: Report given to RN  Post vital signs: stable  Last Vitals:  Vitals Value Taken Time  BP 145/83 06/10/19 1042  Temp    Pulse 67 06/10/19 1045  Resp 14 06/10/19 1044  SpO2 96 % 06/10/19 1045  Vitals shown include unvalidated device data.  Last Pain:  Vitals:   06/10/19 0846  TempSrc: Tympanic  PainSc: 0-No pain         Complications: No apparent anesthesia complications

## 2019-06-14 LAB — SURGICAL PATHOLOGY

## 2019-06-14 NOTE — Anesthesia Postprocedure Evaluation (Signed)
Anesthesia Post Note  Patient: Juan [name]  Procedure(s) Performed: FULGURATION ANAL WART (N/A Buttocks)  Patient location during evaluation: PACU Anesthesia Type: General Level of consciousness: awake and alert Pain management: pain level controlled Vital Signs Assessment: post-procedure vital signs reviewed and stable Respiratory status: spontaneous breathing, nonlabored ventilation and respiratory function stable Cardiovascular status: blood pressure returned to baseline and stable Postop Assessment: no apparent nausea or vomiting Anesthetic complications: no     Last Vitals:  Vitals:   06/10/19 1110 06/10/19 1128  BP: 136/82 135/75  Pulse: 77 (!) 54  Resp: 15 17  Temp: (!) 36.3 C 36.8 C  SpO2: 99% 100%    Last Pain:  Vitals:   06/10/19 1128  TempSrc: Tympanic  PainSc: 0-No pain                 Alphonsus Sias

## 2020-04-19 DEATH — deceased

## 2020-06-09 ENCOUNTER — Emergency Department
Admission: EM | Admit: 2020-06-09 | Discharge: 2020-06-09 | Disposition: A | Discharge status: Home / Self Care | Payer: Medicare HMO | Attending: Emergency Medicine | Admitting: Emergency Medicine

## 2020-06-09 ENCOUNTER — Other Ambulatory Visit: Payer: Self-pay

## 2020-06-09 DIAGNOSIS — Y33XXXA Other specified events, undetermined intent, initial encounter: Secondary | ICD-10-CM | POA: Diagnosis not present

## 2020-06-09 DIAGNOSIS — T162XXA Foreign body in left ear, initial encounter: Secondary | ICD-10-CM | POA: Diagnosis not present

## 2020-06-09 DIAGNOSIS — I10 Essential (primary) hypertension: Secondary | ICD-10-CM | POA: Diagnosis not present

## 2020-06-09 DIAGNOSIS — Z79899 Other long term (current) drug therapy: Secondary | ICD-10-CM | POA: Insufficient documentation

## 2020-06-09 NOTE — ED Provider Notes (Signed)
[facility] Emergency Department Provider Note  ____________________________________________  Time seen: Approximately 9:14 PM  I have reviewed the triage vital signs and the nursing notes.   HISTORY  Chief Complaint Foreign Body in Ear    HPI Juan [name] is a 79 y.o. male that presents to the emergency department for evaluation of hearing aid piece stuck in left ear today.  Patient states that is uncomfortable.  He would also like to talk to ENT about having the tubes that were placed in his ears removed.  No fever.  Past Medical History:  Diagnosis Date  . Arthritis   . Cataracts, both eyes   . Hearing loss    left ear  . History of kidney stones   . Hypertension     Patient Active Problem List   Diagnosis Date Noted  . Hyperlipidemia 06/26/2018  . B12 deficiency 03/20/2016  . Essential hypertension 03/20/2016  . Seasonal allergic rhinitis 03/20/2016    Past Surgical History:  Procedure Laterality Date  . APPENDECTOMY    . COLONOSCOPY WITH PROPOFOL N/A 09/29/2018   Procedure: COLONOSCOPY WITH PROPOFOL;  Surgeon: Lollie Sails, MD;  Location: [facility] ENDOSCOPY;  Service: Endoscopy;  Laterality: N/A;  . COLONOSCOPY WITH PROPOFOL N/A 04/15/2019   Procedure: COLONOSCOPY WITH PROPOFOL;  Surgeon: Lollie Sails, MD;  Location: [facility] ENDOSCOPY;  Service: Endoscopy;  Laterality: N/A;  . WART FULGURATION N/A 06/10/2019   Procedure: FULGURATION ANAL WART;  Surgeon: Benjamine Sprague, DO;  Location: [facility] ORS;  Service: General;  Laterality: N/A;    Prior to Admission medications   Medication Sig Start Date End Date Taking? Authorizing Provider  fluticasone (FLONASE) 50 MCG/ACT nasal spray Place 1 spray into both nostrils as needed for allergies or rhinitis.    [provider]  Glycerin-Hypromellose-PEG 400 0.2-0.36-1 % SOLN Apply 2 drops to eye as needed.    [provider]  hydroxypropyl methylcellulose / hypromellose (ISOPTO TEARS  / GONIOVISC) 2.5 % ophthalmic solution Place 1 drop into both eyes daily as needed for dry eyes.    [provider]  ibuprofen (ADVIL) 800 MG tablet Take 1 tablet (800 mg total) by mouth every 8 (eight) hours as needed for mild pain or moderate pain. 06/10/19   Lysle Pearl, Isami, DO  lisinopril-hydrochlorothiazide (PRINZIDE,ZESTORETIC) 20-12.5 MG tablet Take 1 tablet by mouth at bedtime.     [provider]  Menthol-Methyl Salicylate (MUSCLE RUB) 10-15 % CREA Apply 1 application topically as needed for muscle pain.    [provider]  Naphazoline-Glycerin (CLEAR EYES MAX REDNESS RELIEF OP) Apply 2 drops to eye as needed.    [provider]  vitamin B-12 (CYANOCOBALAMIN) 50 MCG tablet Take 50 mcg by mouth daily.    [provider]    Allergies Sulfa antibiotics  Family History  Problem Relation Age of Onset  . Hypertension Father     Social History Social History   Tobacco Use  . Smoking status: Never Smoker  . Smokeless tobacco: Never Used  Vaping Use  . Vaping Use: Never used  Substance Use Topics  . Alcohol use: No  . Drug use: No     Review of Systems  Constitutional: No fever/chills ENT: No upper respiratory complaints. Positive for foreign body. Gastrointestinal: No nausea, no vomiting.  Musculoskeletal: Negative for musculoskeletal pain. Skin: Negative for rash, abrasions, lacerations, ecchymosis. Neurological: Negative for headaches   ____________________________________________   PHYSICAL EXAM:  VITAL SIGNS: ED Triage Vitals  Enc Vitals Group  BP 06/09/20 2016 (!) 177/63     Pulse Rate 06/09/20 2016 79     Resp 06/09/20 2016 18     Temp 06/09/20 2016 97.9 F (36.6 C)     Temp Source 06/09/20 2016 Oral     SpO2 06/09/20 2016 97 %     Weight 06/09/20 2017 142 lb (64.4 kg)     Height 06/09/20 2017 5\' 5"  (1.651 m)     Head Circumference --      Peak Flow --      Pain Score --      Pain Loc --      Pain Edu? --       Excl. in [place]? --      Constitutional: Alert and oriented. Well appearing and in no acute distress. Eyes: Conjunctivae are normal. PERRL. EOMI. Head: Atraumatic. ENT:      Ears: Plastic hearing aid piece in left ear.      Nose: No congestion/rhinnorhea.      Mouth/Throat: Mucous membranes are moist.  Neck: No stridor.  Cardiovascular: Normal rate, regular rhythm.  Good peripheral circulation. Respiratory: Normal respiratory effort without tachypnea or retractions. Lungs CTAB. Good air entry to the bases with no decreased or absent breath sounds. Musculoskeletal: Full range of motion to all extremities. No gross deformities appreciated. Neurologic:  Normal speech and language. No gross focal neurologic deficits are appreciated.  Skin:  Skin is warm, dry and intact. No rash noted. Psychiatric: Mood and affect are normal. Speech and behavior are normal. Patient exhibits appropriate insight and judgement.   ____________________________________________   LABS (all labs ordered are listed, but only abnormal results are displayed)  Labs Reviewed - No data to display ____________________________________________  EKG   ____________________________________________  RADIOLOGY   No results found.  ____________________________________________    PROCEDURES  Procedure(s) performed:    .Foreign Body Removal  Date/Time: 06/09/2020 9:15 PM Performed by: Laban Emperor, PA-C Authorized by: Laban Emperor, PA-C  Body area: ear Localization method: visualized Removal mechanism: forceps Complexity: simple 1 objects recovered. Objects recovered: hearing aid piece Post-procedure assessment: foreign body removed Patient tolerance: patient tolerated the procedure well with no immediate complications      Medications - No data to display   ____________________________________________   INITIAL IMPRESSION / ASSESSMENT AND PLAN / ED COURSE  Pertinent labs & imaging  results that were available during my care of the patient were reviewed by me and considered in my medical decision making (see chart for details).  Review of the [place] CSRS was performed in accordance of the [place] prior to dispensing any controlled drugs.   Patient presented to the emergency department for evaluation of ear foreign body.  Vital signs and exam are reassuring.  Hearing aid piece was removed.  Patient does have a tube in place to his left ear.  He was given a referral to ENT to discuss removal.  Patient is to follow up with ENT as directed. Patient is given ED precautions to return to the ED for any worsening or new symptoms.   Juan [name] was evaluated in Emergency Department on 06/09/2020 for the symptoms described in the history of present illness. He was evaluated in the context of the global COVID-19 pandemic, which necessitated consideration that the patient might be at risk for infection with the SARS-CoV-2 virus that causes COVID-19. Institutional protocols and algorithms that pertain to the evaluation of patients at risk for COVID-19 are in a state of rapid change based on  information released by regulatory bodies including the CDC and federal and state organizations. These policies and algorithms were followed during the patient's care in the ED.  ____________________________________________  FINAL CLINICAL IMPRESSION(S) / ED DIAGNOSES  Final diagnoses:  Foreign body of left ear, initial encounter      NEW MEDICATIONS STARTED DURING THIS VISIT:  ED Discharge Orders    None          This chart was dictated using voice recognition software/Dragon. Despite best efforts to proofread, errors can occur which can change the meaning. Any change was purely unintentional.    Laban Emperor, PA-C 06/09/20 2118    Duffy Bruce, MD 06/13/20 0130

## 2020-06-09 NOTE — ED Triage Notes (Signed)
Pt to ed via pov. Was trying to take out his hearing aid and a piece broke off and is now stuck in his ear. PT states that it burns.

## 2020-12-07 ENCOUNTER — Other Ambulatory Visit: Payer: Self-pay | Admitting: Otolaryngology

## 2020-12-07 DIAGNOSIS — H9201 Otalgia, right ear: Secondary | ICD-10-CM

## 2020-12-14 ENCOUNTER — Other Ambulatory Visit: Payer: Self-pay

## 2020-12-14 ENCOUNTER — Ambulatory Visit
Admission: RE | Admit: 2020-12-14 | Discharge: 2020-12-14 | Disposition: A | Discharge status: Home / Self Care | Payer: Medicare Other | Source: Ambulatory Visit | Attending: Otolaryngology | Admitting: Otolaryngology

## 2020-12-14 DIAGNOSIS — H9201 Otalgia, right ear: Secondary | ICD-10-CM | POA: Diagnosis not present

## 2021-04-08 ENCOUNTER — Ambulatory Visit: Admission: EM | Admit: 2021-04-08 | Discharge: 2021-04-08 | Payer: Medicare Other

## 2021-04-08 ENCOUNTER — Observation Stay (HOSPITAL_COMMUNITY)
Admission: EM | Admit: 2021-04-08 | Discharge: 2021-04-09 | Disposition: A | Discharge status: Home / Self Care | Payer: Medicare Other | Attending: Emergency Medicine | Admitting: Emergency Medicine

## 2021-04-08 ENCOUNTER — Other Ambulatory Visit: Payer: Self-pay

## 2021-04-08 ENCOUNTER — Encounter (HOSPITAL_COMMUNITY): Payer: Self-pay | Admitting: Emergency Medicine

## 2021-04-08 DIAGNOSIS — R531 Weakness: Secondary | ICD-10-CM | POA: Diagnosis present

## 2021-04-08 DIAGNOSIS — R000 Tachycardia, unspecified: Secondary | ICD-10-CM | POA: Diagnosis not present

## 2021-04-08 DIAGNOSIS — U071 COVID-19: Principal | ICD-10-CM | POA: Diagnosis present

## 2021-04-08 DIAGNOSIS — I959 Hypotension, unspecified: Secondary | ICD-10-CM

## 2021-04-08 DIAGNOSIS — N179 Acute kidney failure, unspecified: Secondary | ICD-10-CM

## 2021-04-08 DIAGNOSIS — Z28311 Partially vaccinated for covid-19: Secondary | ICD-10-CM | POA: Diagnosis not present

## 2021-04-08 DIAGNOSIS — Z79899 Other long term (current) drug therapy: Secondary | ICD-10-CM | POA: Diagnosis not present

## 2021-04-08 DIAGNOSIS — R42 Dizziness and giddiness: Secondary | ICD-10-CM | POA: Diagnosis not present

## 2021-04-08 DIAGNOSIS — I10 Essential (primary) hypertension: Secondary | ICD-10-CM | POA: Insufficient documentation

## 2021-04-08 LAB — COMPREHENSIVE METABOLIC PANEL
ALT: 19 U/L (ref 0–44)
AST: 22 U/L (ref 15–41)
Albumin: 3.8 g/dL (ref 3.5–5.0)
Alkaline Phosphatase: 43 U/L (ref 38–126)
Anion gap: 9 (ref 5–15)
BUN: 40 mg/dL — ABNORMAL HIGH (ref 8–23)
CO2: 23 mmol/L (ref 22–32)
Calcium: 9 mg/dL (ref 8.9–10.3)
Chloride: 103 mmol/L (ref 98–111)
Creatinine, Ser: 2.75 mg/dL — ABNORMAL HIGH (ref 0.61–1.24)
GFR, Estimated: 23 mL/min — ABNORMAL LOW (ref >60)
Glucose, Bld: 125 mg/dL — ABNORMAL HIGH (ref 70–99)
Potassium: 5 mmol/L (ref 3.5–5.1)
Sodium: 135 mmol/L (ref 135–145)
Total Bilirubin: 0.5 mg/dL (ref 0.3–1.2)
Total Protein: 6.9 g/dL (ref 6.5–8.1)

## 2021-04-08 LAB — CBC WITH DIFFERENTIAL/PLATELET
Abs Immature Granulocytes: 0.02 10*3/uL (ref 0.00–0.07)
Basophils Absolute: 0 10*3/uL (ref 0.0–0.1)
Basophils Relative: 1 %
Eosinophils Absolute: 0 10*3/uL (ref 0.0–0.5)
Eosinophils Relative: 0 %
HCT: 41 % (ref 39.0–52.0)
Hemoglobin: 13.5 g/dL (ref 13.0–17.0)
Immature Granulocytes: 0 %
Lymphocytes Relative: 15 %
Lymphs Abs: 0.8 10*3/uL (ref 0.7–4.0)
MCH: 31.9 pg (ref 26.0–34.0)
MCHC: 32.9 g/dL (ref 30.0–36.0)
MCV: 96.9 fL (ref 80.0–100.0)
Monocytes Absolute: 0.5 10*3/uL (ref 0.1–1.0)
Monocytes Relative: 9 %
Neutro Abs: 4.1 10*3/uL (ref 1.7–7.7)
Neutrophils Relative %: 75 %
Platelets: 151 10*3/uL (ref 150–400)
RBC: 4.23 MIL/uL (ref 4.22–5.81)
RDW: 13.3 % (ref 11.5–15.5)
WBC: 5.5 10*3/uL (ref 4.0–10.5)
nRBC: 0 % (ref 0.0–0.2)

## 2021-04-08 LAB — POC SARS CORONAVIRUS 2 AG -  ED: SARSCOV2ONAVIRUS 2 AG: POSITIVE — AB

## 2021-04-08 MED ORDER — SODIUM CHLORIDE 0.9 % IV BOLUS
1000.0000 mL | Freq: Once | INTRAVENOUS | Status: AC
  Administered 2021-04-08: 1000 mL via INTRAVENOUS

## 2021-04-08 MED ORDER — ACETAMINOPHEN 325 MG PO TABS: 650.0000 mg | ORAL_TABLET | Freq: Four times a day (QID) | ORAL | Status: DC | PRN

## 2021-04-08 MED ORDER — ENOXAPARIN SODIUM 40 MG/0.4ML IJ SOSY
40.0000 mg | PREFILLED_SYRINGE | Freq: Every day | INTRAMUSCULAR | Status: DC
  Administered 2021-04-08: 40 mg via SUBCUTANEOUS
  Filled 2021-04-08: qty 0.4

## 2021-04-08 MED ORDER — LACTATED RINGERS IV SOLN: INTRAVENOUS | Status: DC

## 2021-04-08 MED ORDER — VITAMIN B-12 100 MCG PO TABS
50.0000 ug | ORAL_TABLET | Freq: Every day | ORAL | Status: DC
  Administered 2021-04-08 – 2021-04-09 (×2): 50 ug via ORAL
  Filled 2021-04-08 (×4): qty 1

## 2021-04-08 NOTE — ED Provider Notes (Signed)
[facility]ERG[facility] DEPARTMENT Provider Note   CSN: [id number] Arrival date & time: 04/08/21  1225     History Chief Complaint  Patient presents with   Weakness    Juan [name] is a 80 y.o. male with a hx of hypertension & hyperlipidemia who presents to the ED from [facility] for evaluation of generalized weakness & lightheadedness. Patient reports feeling poorly starting 04/03/21 with decreased appetite, loss of taste, sore throat, and weakness, tested positive for COVID 19. Has had poor PO intake, has started to feel lightheaded/dizzy with standing over the past few days with low BP readings at home. No other alleviating/aggravating factors. Seen by UC provider & sent to the ED. Denies fever, vomiting, diarrhea, chest pain, dyspnea, hemoptysis, or abdominal pain. He is vaccinated with 1 booster, no anti-viral medicines.   HPI     Past Medical History:  Diagnosis Date   Arthritis    Cataracts, both eyes    Hearing loss    left ear   History of kidney stones    Hypertension     Patient Active Problem List   Diagnosis Date Noted   Hyperlipidemia 06/26/2018   B12 deficiency 03/20/2016   Essential hypertension 03/20/2016   Seasonal allergic rhinitis 03/20/2016    Past Surgical History:  Procedure Laterality Date   APPENDECTOMY     COLONOSCOPY WITH PROPOFOL N/A 09/29/2018   Procedure: COLONOSCOPY WITH PROPOFOL;  Surgeon: Lollie Sails, MD;  Location: [facility] ENDOSCOPY;  Service: Endoscopy;  Laterality: N/A;   COLONOSCOPY WITH PROPOFOL N/A 04/15/2019   Procedure: COLONOSCOPY WITH PROPOFOL;  Surgeon: Lollie Sails, MD;  Location: [facility] ENDOSCOPY;  Service: Endoscopy;  Laterality: N/A;   WART FULGURATION N/A 06/10/2019   Procedure: FULGURATION ANAL WART;  Surgeon: Benjamine Sprague, DO;  Location: [facility] ORS;  Service: General;  Laterality: N/A;       Family History  Problem Relation Age of Onset   Hypertension Father     Social History   Tobacco Use    Smoking status: Never   Smokeless tobacco: Never  Vaping Use   Vaping Use: Never used  Substance Use Topics   Alcohol use: No   Drug use: No    Home Medications Prior to Admission medications   Medication Sig Start Date End Date Taking? Authorizing Provider  fluticasone (FLONASE) 50 MCG/ACT nasal spray Place 1 spray into both nostrils as needed for allergies or rhinitis.    [provider]  Glycerin-Hypromellose-PEG 400 0.2-0.36-1 % SOLN Apply 2 drops to eye as needed.    [provider]  hydroxypropyl methylcellulose / hypromellose (ISOPTO TEARS / GONIOVISC) 2.5 % ophthalmic solution Place 1 drop into both eyes daily as needed for dry eyes.    [provider]  ibuprofen (ADVIL) 800 MG tablet Take 1 tablet (800 mg total) by mouth every 8 (eight) hours as needed for mild pain or moderate pain. 06/10/19   Lysle Pearl, Isami, DO  lisinopril-hydrochlorothiazide (PRINZIDE,ZESTORETIC) 20-12.5 MG tablet Take 1 tablet by mouth at bedtime.     [provider]  Menthol-Methyl Salicylate (MUSCLE RUB) 10-15 % CREA Apply 1 application topically as needed for muscle pain.    [provider]  Naphazoline-Glycerin (CLEAR EYES MAX REDNESS RELIEF OP) Apply 2 drops to eye as needed.    [provider]  vitamin B-12 (CYANOCOBALAMIN) 50 MCG tablet Take 50 mcg by mouth daily.    [provider]    Allergies    Sulfa antibiotics  Review  of Systems   Review of Systems  Constitutional:  Positive for fatigue. Negative for chills and fever.  HENT:  Positive for congestion and sore throat.   Respiratory:  Negative for shortness of breath.   Cardiovascular:  Negative for chest pain.  Gastrointestinal:  Negative for abdominal pain, diarrhea and vomiting.  Neurological:  Positive for dizziness, weakness and light-headedness. Negative for syncope.  All other systems reviewed and are negative.  Physical Exam Updated Vital Signs BP 95/61 (BP Location: Right  Arm)   Pulse 65   Temp (!) 97.4 F (36.3 C) (Oral)   Resp 15   SpO2 96%   Physical Exam Vitals and nursing note reviewed.  Constitutional:      General: He is not in acute distress.    Appearance: He is well-developed. He is not toxic-appearing.  HENT:     Head: Normocephalic and atraumatic.     Right Ear: Ear canal normal. Tympanic membrane is not perforated, erythematous, retracted or bulging.     Left Ear: Ear canal normal. Tympanic membrane is not perforated, erythematous, retracted or bulging.     Ears:     Comments: No mastoid erythema/swellng/tenderness.     Nose:     Right Sinus: No maxillary sinus tenderness or frontal sinus tenderness.     Left Sinus: No maxillary sinus tenderness or frontal sinus tenderness.     Mouth/Throat:     Mouth: Mucous membranes are dry.     Pharynx: Oropharynx is clear. Uvula midline. No oropharyngeal exudate or posterior oropharyngeal erythema.     Comments: Posterior oropharynx is symmetric appearing. Patient tolerating own secretions without difficulty. No trismus. No drooling. No hot potato voice. No swelling beneath the tongue, submandibular compartment is soft.  Eyes:     General:        Right eye: No discharge.        Left eye: No discharge.     Conjunctiva/sclera: Conjunctivae normal.  Cardiovascular:     Rate and Rhythm: Normal rate and regular rhythm.  Pulmonary:     Effort: Pulmonary effort is normal. No respiratory distress.     Breath sounds: Normal breath sounds. No wheezing, rhonchi or rales.  Abdominal:     General: There is no distension.     Palpations: Abdomen is soft.     Tenderness: There is no abdominal tenderness. There is no guarding or rebound.  Musculoskeletal:     Cervical back: Neck supple. No rigidity.     Right lower leg: No edema.     Left lower leg: No edema.  Lymphadenopathy:     Cervical: No cervical adenopathy.  Skin:    General: Skin is warm and dry.     Findings: No rash.  Neurological:      Mental Status: He is alert.     Comments: Sensation & strength grossly intact x 4.   Psychiatric:        Behavior: Behavior normal.    ED Results / Procedures / Treatments   Labs (all labs ordered are listed, but only abnormal results are displayed) Labs Reviewed  POC SARS CORONAVIRUS 2 AG -  ED - Abnormal; Notable for the following components:      Result Value   SARSCOV2ONAVIRUS 2 AG POSITIVE (*)    All other components within normal limits  CBC WITH DIFFERENTIAL/PLATELET  COMPREHENSIVE METABOLIC PANEL  POC SARS CORONAVIRUS 2 AG -  ED    EKG None  Radiology No results found.  Procedures Procedures  Medications Ordered in ED Medications - No data to display  ED Course  I have reviewed the triage vital signs and the nursing notes.  Pertinent labs & imaging results that were available during my care of the patient were reviewed by me and considered in my medical decision making (see chart for details).  DELLIS ALYEA was evaluated in Emergency Department on 04/08/2021 for the symptoms described in the history of present illness. He/she was evaluated in the context of the global COVID-19 pandemic, which necessitated consideration that the patient might be at risk for infection with the SARS-CoV-2 virus that causes COVID-19. Institutional protocols and algorithms that pertain to the evaluation of patients at risk for COVID-19 are in a state of rapid change based on information released by regulatory bodies including the CDC and federal and state organizations. These policies and algorithms were followed during the patient's care in the ED.    MDM Rules/Calculators/A&P                           Patient presents to the ED with complaints of weakness & dizziness/lightheadedness when standing in the setting of poor PO intake with COVID 19. Nontoxic, BP soft. Exam with dry mm, otherwise fairly benign.    Additional history obtained:  Additional history obtained from chart  review & nursing note review.   EKG: Sinus rhythm.   Lab Tests:  I Ordered, reviewed, and interpreted labs, which included:  COVID: Positive CBC: Unremarkable.  CMP: AKI- creatinine 2.75 today- 1.40 one year prior.   ED Course:  Orthostatic VS for the past 24 hrs:  BP- Lying Pulse- Lying BP- Sitting Pulse- Sitting BP- Standing at 0 minutes Pulse- Standing at 0 minutes  04/08/21 1412 (!) 89/60 58 104/58 64 90/60 66    Patient with AKI & soft Bps in the setting of COVID 19. Fluids being administered. Plan to discuss w/ medicine for admission. Updated patient & his daughter regarding results & plan of care- in agreement.   15:33: CONSULT: Discussed with IM teaching service- accept admission.   Findings and plan of care discussed with supervising physician Dr. Johnney Killian who is in agreement.   Portions of this note were generated with Lobbyist. Dictation errors may occur despite best attempts at proofreading.  Final Clinical Impression(s) / ED Diagnoses Final diagnoses:  Acute kidney injury [facility])  COVID-19    Rx / DC Orders ED Discharge Orders     None        Amaryllis Dyke, PA-C 04/08/21 1537    Charlesetta Shanks, MD 04/21/21 [phone]

## 2021-04-08 NOTE — ED Provider Notes (Signed)
CHIEF COMPLAINT:   Chief Complaint  Patient presents with   Dizziness   Covid Positive     SUBJECTIVE/HPI:   Dizziness A very pleasant 80 y.o.Male presents today with dizziness and testing positive for COVID-19.  Patient presents with his daughter today who reports that he has not been drinking very well.  Patient reports concern with his blood pressure as he states that he has been getting readings at home of systolic [id number] to 0000000.  Patient is vaccinated against COVID-19. Patient does not report any shortness of breath, chest pain, palpitations, visual changes, weakness, tingling, headache, nausea, vomiting, diarrhea, fever, chills.   has a past medical history of Arthritis, Cataracts, both eyes, Hearing loss, History of kidney stones, and Hypertension. ROS:  Review of Systems  Neurological:  Positive for dizziness.  See Subjective/HPI Medications, Allergies and Problem List personally reviewed in Epic today OBJECTIVE:   Vitals:   04/08/21 1150  BP: 97/68  Pulse: (!) 115  Resp: 16  Temp: 98.1 F (36.7 C)  SpO2: 97%    Physical Exam   General: Appears well-developed and well-nourished. No acute distress.  HEENT Head: Normocephalic and atraumatic.  Ears: Hearing grossly intact, no drainage or visible deformity.  Eyes: Conjunctivae and EOM are normal. No eye drainage or scleral icterus bilaterally.  Neck: Normal range of motion, neck is supple.  Cardiovascular: Tachycardia. Regular rhythm; no murmurs, gallops, or rubs.  Pulm/Chest: No respiratory distress.  Rhonchi noted to bilateral lower lung fields.  Bilateral upper lung fields CTA. Musculoskeletal: No joint deformity, normal range of motion.  Neurological: Alert and oriented to person, place, and time.  Skin: Skin is warm and dry.  Psychiatric: Normal mood, affect, behavior, and thought content.   Vital signs and nursing note reviewed.   Patient stable and cooperative with examination.  LABS/X-RAYS/EKG/MEDS:   No  results found for any visits on 04/08/21.  MEDICAL DECISION MAKING:   Patient presents with dizziness and testing positive for COVID-19.  Patient presents with his daughter today who reports that he has not been drinking very well.  Patient reports concern with his blood pressure as he states that he has been getting readings at home of systolic Q000111Q to 0000000.  Patient is vaccinated against COVID-19. Patient does not report any shortness of breath, chest pain, palpitations, visual changes, weakness, tingling, headache, nausea, vomiting, diarrhea, fever, chills.  Chart review completed.  Given symptoms along with assessment findings, concerned that the patient will need a higher level of care in the emergency department.  Likely cause of tachycardia and decreased blood pressure potentially due to dehydration, but will need further work-up given his adventitious lung sounds and concerns today.  Patient and daughter verbalized understanding and agreed with plan.  Daughter to take the patient to the emergency department via private vehicle.  Patient is stable and I am comfortable with him taking a POV to the emergency department without EMS transport.  Return as needed.  ASSESSMENT/PLAN:  1. COVID-19 virus detected  2. Dizziness  3. Hypotension, unspecified hypotension type  4. Tachycardia  Instructions about new medications and side effects provided.  Plan:   Discharge Instructions      Your current condition warrants further evaluation and/or treatment which exceed services available to you in this urgent care setting. I have discussed with you your currrent condition and the need for further evaluation and/or treatment in an emergency department setting. In response to my medical recommendation, you have opted to go to the emergency department.  Serafina Royals, [place] 04/08/21 1155

## 2021-04-08 NOTE — Discharge Instructions (Addendum)
Your current condition warrants further evaluation and/or treatment which exceed services available to you in this urgent care setting. I have discussed with you your currrent condition and the need for further evaluation and/or treatment in an emergency department setting. In response to my medical recommendation, you have opted to go to the emergency department. 

## 2021-04-08 NOTE — ED Notes (Addendum)
Patient is being discharged from the Urgent Care and sent to the Emergency Department via POV . Per Boddu, NP patient is in need of higher level of care due to tachycardia. Patient is aware and verbalizes understanding of plan of care.   Vitals:   04/08/21 1150  BP: 97/68  Pulse: (!) 115  Resp: 16  Temp: 98.1 F (36.7 C)  SpO2: 97%

## 2021-04-08 NOTE — ED Triage Notes (Signed)
Pt sent from UC for C/O dizziness that started yesterday. Pt also Dx with COVID today. Daughter reports decrease in PO intake.

## 2021-04-08 NOTE — H&P (Signed)
Date: 04/08/2021               Patient Name:  Juan [name] MRN: [id number]  DOB: [birth year] Age / Sex: 80 y.o., male   PCP: Juluis Pitch, MD         Medical Service: Internal Medicine Teaching Service         Attending Physician: Dr. Lucious Groves, DO    First Contact: Dr. Darrick Meigs Pager: [phone]            After Hours (After 5p/  First Contact Pager: [phone]  weekends / holidays): Second Contact Pager: [phone]   Chief Complaint: URI symptoms  History of Present Illness:  Juan [name] is a healthy 80 year old male with hypertension who presented to [facility] 04/08/21 for 5 day history of sore throat, sinus congestion, decreased appetite, weakness and lightheadedness over the past 5 days. He reports abnormally low blood pressure readings at home. He denies fevers, chills, nausea, vomiting, diarrhea, chest pain, palpitations, or shortness of breath.  Home COVID test returned positive.  He reports undergoing COVID 19 vaccination with one booster dose.   He resides at home independently and can perform all of his own ADL's.   Meds:  No current facility-administered medications on file prior to encounter.   Current Outpatient Medications on File Prior to Encounter  Medication Sig Dispense Refill   fluticasone (FLONASE) 50 MCG/ACT nasal spray Place 1 spray into both nostrils as needed for allergies or rhinitis.     ibuprofen (ADVIL) 200 MG tablet Take 200-400 mg by mouth every 6 (six) hours as needed for headache (pain).     lisinopril-hydrochlorothiazide (PRINZIDE,ZESTORETIC) 20-12.5 MG tablet Take 1 tablet by mouth daily.     Menthol-Methyl Salicylate (MUSCLE RUB) 10-15 % CREA Apply 1 application topically daily as needed for muscle pain.     Naphazoline-Glycerin (CLEAR EYES MAX REDNESS RELIEF OP) Place 2 drops into both eyes daily as needed (dry eyes).     vitamin B-12 (CYANOCOBALAMIN) 50 MCG tablet Take 50 mcg by mouth daily.       Allergies: Allergies as of  04/08/2021 - Review Complete 04/08/2021  Allergen Reaction Noted   Sulfa antibiotics Other (See Comments) 04/22/2016   Past Medical History:  Diagnosis Date   Arthritis    Cataracts, both eyes    Hearing loss    left ear   History of kidney stones    Hypertension    Family History  Problem Relation Age of Onset   Hypertension Father      Social History: denies alcohol, tobacco or illicit drug use.   Review of Systems: A complete ROS was negative except as per HPI.   Physical Exam: Blood pressure (!) 102/57, pulse 63, temperature (!) 97.4 F (36.3 C), temperature source Oral, resp. rate (!) 22, SpO2 99 %.  General: well appearing elderly male HENT: no pharyngeal erythema or exudates. Dry MM. Bilateral presbycusis. Normal TM and external canals bilaterally.  Eyes: no scleral icterus or conjunctival injection Cardiac: RRR, no LE edema Pulm: breathing comfortably on room air, lungs clear GI: soft, non-tender to palpation Neuro: alert and oriented. No focal deficit on limited exam. MSK: normal muscle tone. No tremor Skin: no rash or lesion on limited exam.  EKG: normal rate, sinus rhythm with occasional PACs  Assessment & Plan by Problem: Active Problems:   COVID-19  Symptomatic COVID 19 infection Received COVID 19 vaccination +1 booster so will forgo pharmacologic treatment. 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  Dispo: Admit patient to Observation with expected length of stay less than 2 midnights. Suspect he may be able to discharge home tomorrow if fluid intake and renal function improve.   Signed: Mitzi Hansen,  MD Internal Medicine Resident PGY-3 [facility] Pager: [phone] 04/08/2021 5:05 PM

## 2021-04-08 NOTE — ED Triage Notes (Signed)
Patient presents to Urgent Care with complaints of dizziness since yesterday. Daughter states he tested positive for covid today. She reports she has noted he has had a decrease in PO intake since yesterday. Pt reports concern with his BP he states his BP ranges have been in the [id number] and [id number] systolic.   Denies fever.

## 2021-04-09 DIAGNOSIS — I10 Essential (primary) hypertension: Secondary | ICD-10-CM

## 2021-04-09 DIAGNOSIS — R001 Bradycardia, unspecified: Secondary | ICD-10-CM

## 2021-04-09 LAB — CBC WITH DIFFERENTIAL/PLATELET
Abs Immature Granulocytes: 0.01 10*3/uL (ref 0.00–0.07)
Basophils Absolute: 0 10*3/uL (ref 0.0–0.1)
Basophils Relative: 0 %
Eosinophils Absolute: 0 10*3/uL (ref 0.0–0.5)
Eosinophils Relative: 1 %
HCT: 40 % (ref 39.0–52.0)
Hemoglobin: 13.4 g/dL (ref 13.0–17.0)
Immature Granulocytes: 0 %
Lymphocytes Relative: 33 %
Lymphs Abs: 1.3 10*3/uL (ref 0.7–4.0)
MCH: 32.2 pg (ref 26.0–34.0)
MCHC: 33.5 g/dL (ref 30.0–36.0)
MCV: 96.2 fL (ref 80.0–100.0)
Monocytes Absolute: 0.4 10*3/uL (ref 0.1–1.0)
Monocytes Relative: 10 %
Neutro Abs: 2.2 10*3/uL (ref 1.7–7.7)
Neutrophils Relative %: 56 %
Platelets: 141 10*3/uL — ABNORMAL LOW (ref 150–400)
RBC: 4.16 MIL/uL — ABNORMAL LOW (ref 4.22–5.81)
RDW: 13.2 % (ref 11.5–15.5)
WBC: 3.9 10*3/uL — ABNORMAL LOW (ref 4.0–10.5)
nRBC: 0 % (ref 0.0–0.2)

## 2021-04-09 LAB — COMPREHENSIVE METABOLIC PANEL
ALT: 17 U/L (ref 0–44)
AST: 18 U/L (ref 15–41)
Albumin: 3.2 g/dL — ABNORMAL LOW (ref 3.5–5.0)
Alkaline Phosphatase: 37 U/L — ABNORMAL LOW (ref 38–126)
Anion gap: 7 (ref 5–15)
BUN: 39 mg/dL — ABNORMAL HIGH (ref 8–23)
CO2: 24 mmol/L (ref 22–32)
Calcium: 8.4 mg/dL — ABNORMAL LOW (ref 8.9–10.3)
Chloride: 104 mmol/L (ref 98–111)
Creatinine, Ser: 2.19 mg/dL — ABNORMAL HIGH (ref 0.61–1.24)
GFR, Estimated: 30 mL/min — ABNORMAL LOW (ref >60)
Glucose, Bld: 87 mg/dL (ref 70–99)
Potassium: 4.5 mmol/L (ref 3.5–5.1)
Sodium: 135 mmol/L (ref 135–145)
Total Bilirubin: 0.5 mg/dL (ref 0.3–1.2)
Total Protein: 5.9 g/dL — ABNORMAL LOW (ref 6.5–8.1)

## 2021-04-09 LAB — MAGNESIUM: Magnesium: 2.1 mg/dL (ref 1.7–2.4)

## 2021-04-09 LAB — PHOSPHORUS: Phosphorus: 3.6 mg/dL (ref 2.5–4.6)

## 2021-04-09 MED ORDER — PHENOL 1.4 % MT LIQD
1.0000 | OROMUCOSAL | Status: DC | PRN
  Filled 2021-04-09: qty 177

## 2021-04-09 MED ORDER — MENTHOL 3 MG MT LOZG
1.0000 | LOZENGE | OROMUCOSAL | Status: DC | PRN
  Administered 2021-04-09: 3 mg via ORAL
  Filled 2021-04-09: qty 9

## 2021-04-09 MED ORDER — LACTATED RINGERS IV BOLUS
1000.0000 mL | Freq: Once | INTRAVENOUS | Status: AC
  Administered 2021-04-09: 1000 mL via INTRAVENOUS

## 2021-04-09 NOTE — ED Notes (Signed)
Sister updated regarding pt's status. Primary RN unable to take call at this time. Sister would like for someone to call her back.

## 2021-04-09 NOTE — Progress Notes (Signed)
   Summary:  Juan [name] is an 80 year old healthy male with hypertension who was admitted on 8/21 for dehydration, AKI, and lightheadedness secondary to COVID 19.  AKI is improving with fluid repletion and lightheadedness has resolved. Suspect he will be stable for discharge either later today or tomorrow.  Subjective:  Orthostatic vital signs normal last evening.   Bedside nursing reports pt was able to get to bedside commode independently last night.   No significant overnight events. Continues to have a sore throat. Denies shortness of breath or lightheadedness.   His daughter called into the room while I was in there. Updated her that symptoms have improved and that I suspect he may be able to discharge either later today or tomorrow.  Objective:  Vital signs in last 24 hours: Vitals:   04/09/21 0200 04/09/21 0300 04/09/21 0400 04/09/21 0500  BP: 118/60  120/73 138/88  Pulse:  63 (!) 54 (!) 53  Resp: 19  (!) 22 18  Temp:      TempSrc:      SpO2: 92%  97% 97%   General: resting in bed comfortably Cardiac: regular rate and rhythm with occassional missed beat, no LE edema Pulm: breathing comfortably on room air.  Assessment/Plan:  Active Problems:   COVID-19  Symptomatic COVID 19 infection Remains on room air this morning. Lightheadedness improved. Suspect orthostatics were normal last evening after having received IVF. Having occassional missed beats on tele but doubt these are the cause of symptoms since they have resolved with IVF.  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 Normotensive. Continue holding home medications  Mild bradycardia. Unclear chronicity. Suspect the lightheadedness he has been experiencing is secondary to volume depletion, given the time course and congruency with other COVID symptom onset, rather than symptomatic bradycardia as the lightheaded has  resolved with volume repletion. Suspect he could continue to have this followed by his PCP if asymptomatic.   Dispo: 0-1 day  Mitzi Hansen, MD Internal Medicine Resident PGY-3 [facility] Pager: [phone] 04/09/2021 8:31 AM

## 2021-04-09 NOTE — ED Notes (Signed)
Pt ambulated self to bedside commode.

## 2021-04-09 NOTE — Discharge Summary (Signed)
Name: Juan [name] MRN: [id number] DOB: [birth year] 80 y.o. PCP: Juluis Pitch, MD  Date of Admission: 04/08/2021 12:38 PM Date of Discharge: 04/09/2021 Attending Physician: Dr. Heber McCormick  Discharge Diagnosis: Symptomatic COVID 19 infection Acute on chronic renal failure Hypertension Asymptomatic Bradycardia  Discharge Medications: Allergies as of 04/09/2021       Reactions   Sulfa Antibiotics Other (See Comments)   Unknown reaction        Medication List     STOP taking these medications    fluticasone 50 MCG/ACT nasal spray Commonly known as: FLONASE   ibuprofen 200 MG tablet Commonly known as: ADVIL   lisinopril-hydrochlorothiazide 20-12.5 MG tablet Commonly known as: ZESTORETIC       TAKE these medications    CLEAR EYES MAX REDNESS RELIEF OP Place 2 drops into both eyes daily as needed (dry eyes).   Muscle Rub 10-15 % Crea Apply 1 application topically daily as needed for muscle pain.   vitamin B-12 50 MCG tablet Commonly known as: CYANOCOBALAMIN Take 50 mcg by mouth daily.        Disposition and follow-up:   [name] was discharged from [facility] in Stable condition.  At the hospital follow up visit please address:  Acute on chronic renal failure secondary to dehydration.  BMP at time of follow up Encourage oral rehydration  Hypertension Lisinopril-hctz held at discharge, reassess at time of follow up  Asymptomatic Bradycardia (mild) Continue to monitor  Labs / imaging needed at time of follow-up: BMP  Pending labs/ test needing follow-up: none  Follow-up Appointments: PCP 3-5 days  Hospital Course:  Juan [name] is a healthy 80 year old male with hypertension who presented to [facility] on 04/08/21 for 5 day history of sore throat, sinus congestion, decreased appetite, weakness and lightheadedness over the past 2 days. He noted lower blood pressure readings over this time but had continued taking his  blood pressure medication Workup in the ED revealed a positive COVID 19 test and AKI so he was admitted for IV rehydration. Lightheadedness quickly resolved and orthostatic vital signs were normal. AKI improved by hospital day #1 and he felt stable for discharge home. Since he was normotensive while holding his blood pressure medication since admission, this was held at discharge and he was given instructions to follow up with his PCP for re-evaluation.   Over the course of his admission, he was also noted to be bradycardic. I had considered whether or not the bradycardia was symptomatic however, since his light headedness resolved with fluid repletion, while remaining bradycardic, I suspect the bradycardia is asymptomatic. He was informed to follow up with his PCP regarding this as well.  COVID 19 treatment was not pursued since he received full COVID 19 vaccination and he did exhibit respiratory symptoms while hospitalized.  Discharge Vitals:   BP 130/75   Pulse 68   Temp 98.1 F (36.7 C) (Oral)   Resp 16   SpO2 98%   Pertinent Labs, Studies, and Procedures:  CBC Latest Ref Rng & Units 04/09/2021 04/08/2021 04/22/2016  WBC 4.0 - 10.5 K/uL 3.9(L) 5.5 5.2  Hemoglobin 13.0 - 17.0 g/dL 13.4 13.5 13.3  Hematocrit 39.0 - 52.0 % 40.0 41.0 39.0(L)  Platelets 150 - 400 K/uL 141(L) 151 157   BMP Latest Ref Rng & Units 04/09/2021 04/08/2021 04/22/2016  Glucose 70 - 99 mg/dL 87 125(H) 114(H)  BUN 8 - 23 mg/dL 39(H) 40(H) 31(H)  Creatinine 0.61 - 1.24 mg/dL  2.19(H) 2.75(H) 1.41(H)  Sodium 135 - 145 mmol/L 135 135 137  Potassium 3.5 - 5.1 mmol/L 4.5 5.0 4.2  Chloride 98 - 111 mmol/L 104 103 106  CO2 22 - 32 mmol/L '24 23 25  '$ Calcium 8.9 - 10.3 mg/dL 8.4(L) 9.0 9.3     Signed:  Mitzi Hansen, MD Internal Medicine Resident PGY-2 [facility] Pager: [phone] 04/12/2021 6:49 AM

## 2021-04-09 NOTE — ED Notes (Signed)
Admitting at bedside 

## 2021-04-09 NOTE — ED Notes (Signed)
Daughter will not be able to pickup pt until after 17:30

## 2021-04-09 NOTE — ED Notes (Signed)
Daughter updated 

## 2021-10-26 ENCOUNTER — Emergency Department: Payer: Medicare Other

## 2021-10-26 ENCOUNTER — Other Ambulatory Visit: Payer: Self-pay

## 2021-10-26 ENCOUNTER — Inpatient Hospital Stay
Admission: EM | Admit: 2021-10-26 | Discharge: 2021-10-31 | DRG: 282 | Disposition: A | Discharge status: Other Acute Inpatient Hospital | Payer: Medicare Other | Source: Ambulatory Visit | Attending: Family Medicine | Admitting: Family Medicine

## 2021-10-26 DIAGNOSIS — I10 Essential (primary) hypertension: Secondary | ICD-10-CM | POA: Diagnosis not present

## 2021-10-26 DIAGNOSIS — I214 Non-ST elevation (NSTEMI) myocardial infarction: Secondary | ICD-10-CM | POA: Diagnosis not present

## 2021-10-26 DIAGNOSIS — Z79899 Other long term (current) drug therapy: Secondary | ICD-10-CM

## 2021-10-26 DIAGNOSIS — E782 Mixed hyperlipidemia: Secondary | ICD-10-CM

## 2021-10-26 DIAGNOSIS — I25118 Atherosclerotic heart disease of native coronary artery with other forms of angina pectoris: Secondary | ICD-10-CM | POA: Diagnosis present

## 2021-10-26 DIAGNOSIS — D509 Iron deficiency anemia, unspecified: Secondary | ICD-10-CM | POA: Diagnosis present

## 2021-10-26 DIAGNOSIS — Z20822 Contact with and (suspected) exposure to covid-19: Secondary | ICD-10-CM | POA: Diagnosis present

## 2021-10-26 DIAGNOSIS — H9192 Unspecified hearing loss, left ear: Secondary | ICD-10-CM | POA: Diagnosis present

## 2021-10-26 DIAGNOSIS — N189 Chronic kidney disease, unspecified: Secondary | ICD-10-CM

## 2021-10-26 DIAGNOSIS — R778 Other specified abnormalities of plasma proteins: Secondary | ICD-10-CM | POA: Diagnosis not present

## 2021-10-26 DIAGNOSIS — N1831 Chronic kidney disease, stage 3a: Secondary | ICD-10-CM | POA: Diagnosis present

## 2021-10-26 DIAGNOSIS — Z882 Allergy status to sulfonamides status: Secondary | ICD-10-CM

## 2021-10-26 DIAGNOSIS — Z8249 Family history of ischemic heart disease and other diseases of the circulatory system: Secondary | ICD-10-CM

## 2021-10-26 DIAGNOSIS — I129 Hypertensive chronic kidney disease with stage 1 through stage 4 chronic kidney disease, or unspecified chronic kidney disease: Secondary | ICD-10-CM | POA: Diagnosis present

## 2021-10-26 DIAGNOSIS — R001 Bradycardia, unspecified: Secondary | ICD-10-CM | POA: Diagnosis present

## 2021-10-26 DIAGNOSIS — E785 Hyperlipidemia, unspecified: Secondary | ICD-10-CM | POA: Diagnosis present

## 2021-10-26 DIAGNOSIS — E538 Deficiency of other specified B group vitamins: Secondary | ICD-10-CM | POA: Diagnosis present

## 2021-10-26 DIAGNOSIS — R079 Chest pain, unspecified: Secondary | ICD-10-CM | POA: Diagnosis not present

## 2021-10-26 DIAGNOSIS — M1990 Unspecified osteoarthritis, unspecified site: Secondary | ICD-10-CM | POA: Diagnosis present

## 2021-10-26 LAB — BASIC METABOLIC PANEL
Anion gap: 7 (ref 5–15)
BUN: 26 mg/dL — ABNORMAL HIGH (ref 8–23)
CO2: 27 mmol/L (ref 22–32)
Calcium: 9.2 mg/dL (ref 8.9–10.3)
Chloride: 108 mmol/L (ref 98–111)
Creatinine, Ser: 1.55 mg/dL — ABNORMAL HIGH (ref 0.61–1.24)
GFR, Estimated: 45 mL/min — ABNORMAL LOW (ref >60)
Glucose, Bld: 119 mg/dL — ABNORMAL HIGH (ref 70–99)
Potassium: 4 mmol/L (ref 3.5–5.1)
Sodium: 142 mmol/L (ref 135–145)

## 2021-10-26 LAB — RESP PANEL BY RT-PCR (FLU A&B, COVID) ARPGX2
Influenza A by PCR: NEGATIVE
Influenza B by PCR: NEGATIVE
SARS Coronavirus 2 by RT PCR: NEGATIVE

## 2021-10-26 LAB — CBC
HCT: 33.7 % — ABNORMAL LOW (ref 39.0–52.0)
Hemoglobin: 10.5 g/dL — ABNORMAL LOW (ref 13.0–17.0)
MCH: 28.2 pg (ref 26.0–34.0)
MCHC: 31.2 g/dL (ref 30.0–36.0)
MCV: 90.6 fL (ref 80.0–100.0)
Platelets: 231 10*3/uL (ref 150–400)
RBC: 3.72 MIL/uL — ABNORMAL LOW (ref 4.22–5.81)
RDW: 13.9 % (ref 11.5–15.5)
WBC: 6.2 10*3/uL (ref 4.0–10.5)
nRBC: 0 % (ref 0.0–0.2)

## 2021-10-26 LAB — APTT: aPTT: 27 seconds (ref 24–36)

## 2021-10-26 LAB — BRAIN NATRIURETIC PEPTIDE: B Natriuretic Peptide: 129 pg/mL — ABNORMAL HIGH (ref 0.0–100.0)

## 2021-10-26 LAB — TROPONIN I (HIGH SENSITIVITY)
Troponin I (High Sensitivity): 25 ng/L — ABNORMAL HIGH (ref <18)
Troponin I (High Sensitivity): 58 ng/L — ABNORMAL HIGH (ref <18)

## 2021-10-26 MED ORDER — HYDRALAZINE HCL 20 MG/ML IJ SOLN: 5.0000 mg | Freq: Four times a day (QID) | INTRAMUSCULAR | Status: AC | PRN

## 2021-10-26 MED ORDER — MORPHINE SULFATE (PF) 2 MG/ML IV SOLN: 2.0000 mg | INTRAVENOUS | Status: DC | PRN

## 2021-10-26 MED ORDER — ACETAMINOPHEN 325 MG PO TABS
650.0000 mg | ORAL_TABLET | ORAL | Status: DC | PRN
  Administered 2021-10-27: 650 mg via ORAL
  Filled 2021-10-26: qty 2

## 2021-10-26 MED ORDER — HEPARIN (PORCINE) 25000 UT/250ML-% IV SOLN
1000.0000 [IU]/h | INTRAVENOUS | Status: AC
  Administered 2021-10-26 – 2021-10-27 (×2): 850 [IU]/h via INTRAVENOUS
  Administered 2021-10-28: 1000 [IU]/h via INTRAVENOUS
  Filled 2021-10-26 (×3): qty 250

## 2021-10-26 MED ORDER — ASPIRIN 81 MG PO CHEW
324.0000 mg | CHEWABLE_TABLET | Freq: Once | ORAL | Status: AC
  Administered 2021-10-26: 324 mg via ORAL
  Filled 2021-10-26: qty 4

## 2021-10-26 MED ORDER — ONDANSETRON HCL 4 MG/2ML IJ SOLN: 4.0000 mg | Freq: Four times a day (QID) | INTRAMUSCULAR | Status: DC | PRN

## 2021-10-26 MED ORDER — HEPARIN BOLUS VIA INFUSION
4000.0000 [IU] | Freq: Once | INTRAVENOUS | Status: AC
  Administered 2021-10-26: 4000 [IU] via INTRAVENOUS
  Filled 2021-10-26: qty 4000

## 2021-10-26 NOTE — Assessment & Plan Note (Addendum)
-   Given patient's history of hypertension, age, onset of chest pain with exertion, elevated troponin, patient will need to be admitted for chest pain observation ?- Agree with adding BNP to prior lab collection ?- Continue to follow troponin level ?- Complete echo ordered ?- Continue heparin GTT ?- Cardiologist, Dr. Corky Sox has been consulted and states he will see the patient ?

## 2021-10-26 NOTE — Consult Note (Signed)
ANTICOAGULATION CONSULT NOTE - Initial Consult ? ?Pharmacy Consult for heparin infusion dosing ?Indication: chest pain/ACS ? ?Allergies  ?Allergen Reactions  ? Sulfa Antibiotics Other (See Comments)  ?  Unknown reaction  ? ? ?Patient Measurements: ?Height: '5\' 5"'$  (165.1 cm) ?IBW/kg (Calculated) : 61.5 ?Heparin Dosing Weight: 70kg ? ?Vital Signs: ?Temp: 98.5 ?F (36.9 ?C) (03/10 1717) ?Temp Source: Oral (03/10 1717) ?BP: 143/81 (03/10 1800) ?Pulse Rate: 56 (03/10 1800) ? ?Labs: ?Recent Labs  ?  10/26/21 ?1707  ?HGB 10.5*  ?HCT 33.7*  ?PLT 231  ?CREATININE 1.55*  ?TROPONINIHS 25*  ? ? ?CrCl cannot be calculated (Unknown ideal weight.). ? ? ?Medical History: ?Past Medical History:  ?Diagnosis Date  ? Arthritis   ? Cataracts, both eyes   ? 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 History of kidney stones   ? Hypertension   ? ? ?Medications:  ?(Not in a hospital admission)  No anticoagulants documented in medical history from PCP or from pharmacy refill records. ? ?Assessment: ?81yrold male reported to PCP for evaluation of severe chest pain as result of heavy exertion.  Also reported dizziness.  Has noted some SOB on exertion but never had this type pain. ?Goal of Therapy:  ?Heparin level 0.3-0.7 units/ml ?Monitor platelets by anticoagulation protocol: Yes ?  ?Plan:  ?Give 4000 units bolus x 1 ?Begin infusion @ 12units/kg/hr (840 unit/hr-8.480mhr) ?Heparin level to be drawn at 0400 With adjustments as indicated by protocol. ? ?SuBerta Minor3/05/2022,6:43 PM ? ? ? ?

## 2021-10-26 NOTE — Assessment & Plan Note (Signed)
-   Hydralazine 5 mg IV every 6 hours as needed for SBP greater than 170, 2 days ordered ?

## 2021-10-26 NOTE — Assessment & Plan Note (Addendum)
-   Positive delta, initial high sensitive troponin was 25 and increased to 58 ?- Continue heparin GTT ?

## 2021-10-26 NOTE — ED Notes (Signed)
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  ?

## 2021-10-26 NOTE — ED Triage Notes (Addendum)
Patient to ER via [place] from [facility] clinic. Reports that this has been on going issue since October. 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 Reports the pain was so bad he felt light headed and nauseous.  ? ?Reports at this time the chest pain has subsided. ?

## 2021-10-26 NOTE — Assessment & Plan Note (Signed)
-   Lipid panel in the a.m. ?

## 2021-10-26 NOTE — Assessment & Plan Note (Signed)
At baseline 

## 2021-10-26 NOTE — ED Provider Notes (Signed)
? ?[facility] ?Provider Note ? ? ? Event Date/Time  ? First MD Initiated Contact with Patient 10/26/21 1705   ?  (approximate) ? ? ?History  ? ?Chest Pain ? ? ?HPI ? ?Juan [name] is a 81 y.o. male with history of HTN, arthritis, hearing loss, kidney stones, hemorrhoids who presents for evaluation referred from [facility] clinic for assessment of chest pain that started today.  This partly started about 3 hours prior to arrival to clinic patient was moving a large air tank.  Patient states that his symptoms eased off.  States he felt like he was about to pass out a little dizzy when it came on but it symptoms. Patient states he feels essentially back to normal now.  He has never had chest pain like this before.  States he sometimes has some shortness of breath when he is exerting himself but does not usually have any pain associated with this.  No recent or associated fevers, chills, cough, vomiting, diarrhea, abdominal pain, change in his chronic back pain rash or extremity pain.  Patient denies any tobacco abuse or EtOH use.  States he thinks he may be on blood thinner but is not sure. ? ?  ?Past Medical History:  ?Diagnosis Date  ? Arthritis   ? Cataracts, both eyes   ? 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 History of kidney stones   ? Hypertension   ? ? ? ?Physical Exam  ?Triage Vital Signs: ?ED Triage Vitals  ?Enc Vitals Group  ?   BP   ?   Pulse   ?   Resp   ?   Temp   ?   Temp src   ?   SpO2   ?   Weight   ?   Height   ?   Head Circumference   ?   Peak Flow   ?   Pain Score   ?   Pain Loc   ?   Pain Edu?   ?   Excl. in [place]?   ? ? ?Most recent vital signs: ?Vitals:  ? 10/26/21 1753 10/26/21 1800  ?BP:  (!) 143/81  ?Pulse: 63 (!) 56  ?Resp: 19 18  ?Temp:    ?SpO2: 95% 94%  ? ? ?General: Awake, no distress.  ?CV:  Good peripheral perfusion.  ?Resp:  Normal effort.  Bilaterally. ?Abd:  No distention.  Soft. ?Other:  No significant lower extremity. ? ? ?ED Results / Procedures / Treatments   ?Labs ?(all labs ordered are listed, but only abnormal results are displayed) ?Labs Reviewed  ?BASIC METABOLIC PANEL - Abnormal; Notable for the following components:  ?    Result Value  ? Glucose, Bld 119 (*)   ? BUN 26 (*)   ? Creatinine, Ser 1.55 (*)   ? GFR, Estimated 45 (*)   ? All other components within normal limits  ?CBC - Abnormal; Notable for the following components:  ? RBC 3.72 (*)   ? Hemoglobin 10.5 (*)   ? HCT 33.7 (*)   ? All other components within normal limits  ?TROPONIN I (HIGH SENSITIVITY) - Abnormal; Notable for the following components:  ? Troponin I (High Sensitivity) 25 (*)   ? All other components within normal limits  ?RESP PANEL BY RT-PCR (FLU A&B, COVID) ARPGX2  ?BRAIN NATRIURETIC PEPTIDE  ?APTT  ?TROPONIN I (HIGH SENSITIVITY)  ? ? ? ?EKG ? ?EKG is remarkable sinus rhythm with a ventricular rate  of 50, normal axis, unremarkable intervals with T wave inversion in lead III without other clear evidence of acute ischemia or significant arrhythmia.  V3 somewhat diagnostic and artifact. ? ?T wave inversion in III appears new Compared to prior ECG from 04/09/2021. ? ? ?RADIOLOGY ? ?Chest x-ray on my interpretation is remarkable for very early pulm edema without evidence of focal consolidation, effusion, edema, pneumothorax or other clear process.  Also reviewed radiologist interpretation and agree with the findings of possible hiatal hernia versus some atelectasis. ? ? ?PROCEDURES: ? ?Critical Care performed: Yes, see critical care procedure note(s) ? ?.Critical Care ?Performed by: Lucrezia Starch, MD ?Authorized by: Lucrezia Starch, MD  ? ?Critical care provider statement:  ?  Critical care time (minutes):  30 ?  Critical care was necessary to treat or prevent imminent or life-threatening deterioration of the following conditions:  Cardiac failure ?  Critical care was time spent personally by me on the following activities:  Development of treatment plan with patient or surrogate,  discussions with consultants, evaluation of patient's response to treatment, examination of patient, ordering and review of laboratory studies, ordering and review of radiographic studies, ordering and performing treatments and interventions, pulse oximetry, re-evaluation of patient's condition and review of old charts ? ? ? ?MEDICATIONS ORDERED IN ED: ?Medications  ?aspirin chewable tablet 324 mg (324 mg Oral Given 10/26/21 1802)  ? ? ? ?IMPRESSION / MDM / ASSESSMENT AND PLAN / ED COURSE  ?I reviewed the triage vital signs and the nursing notes. ?             ?               ? ?Differential diagnosis includes, but is not limited to ACS, pneumonia, orchitis, MSK pain, GERD with lower suspicion at this time based on history and exam at this time for dissection or PE. ? ? ?EKG is remarkable sinus rhythm with a ventricular rate of 50, normal axis, unremarkable intervals with T wave inversion in lead III without other clear evidence of acute ischemia or significant arrhythmia.  V3 somewhat diagnostic and artifact. T wave inversion in III appears new Compared to prior ECG from 04/09/2021. ? ?Chest x-ray on my interpretation is remarkable for very early pulm edema without evidence of focal consolidation, effusion, edema, pneumothorax or other clear process.  Also reviewed radiologist interpretation and agree with the findings of possible hiatal hernia versus some atelectasis. ? ?CBC showed no leukocytosis and hemoglobin of 10.5 with normal platelets.  BMP shows creatinine of 1.55 compared to 2.126 months ago without any other significant electrolyte or metabolic derangements.  Troponin is elevated 25 and given new T wave inversion in 3 with patient reporting some exertional chest pain earlier today and concern for NSTEMI.  We will give ASA and heparin.  He is chest pain-free at this time and on reassessment.  Element to medicine service for further evaluation and management. ? ?  ? ? ?FINAL CLINICAL IMPRESSION(S) / ED  DIAGNOSES  ? ?Final diagnoses:  ?NSTEMI (non-ST elevated myocardial infarction) ([place])  ?Chest pain, unspecified type  ?Chronic kidney disease, unspecified CKD stage  ? ? ? ?Rx / DC Orders  ? ?ED Discharge Orders   ? ? None  ? ?  ? ? ? ?Note:  This document was prepared using Dragon voice recognition software and may include unintentional dictation errors. ?  ?Lucrezia Starch, MD ?10/26/21 1845 ? ?

## 2021-10-26 NOTE — Hospital Course (Signed)
Juan [name] is an 81 year old male with history of hypertension, hyperlipidemia, B12 deficiency, presents emergency department for chief concerns of chest pain. ? ?Vitals in the emergency department showed temperature of 90.5, respiration rate of 18, heart rate 56, blood pressure 143/81, SPO2 of 94% on room air. ? ?Serum sodium 142, potassium 4.0, chloride 108, bicarb 27, BUN of 26, serum creatinine of 1.55, GFR 45, nonfasting blood glucose 119, WBC 6.2, hemoglobin 10.5, platelets of 231.   ? ?Troponin was 25. ? ?ED treatment: Aspirin 324 mg p.o. one-time dose ?

## 2021-10-26 NOTE — H&P (Signed)
History and Physical   Juan [name] WIO:[id number] DOB: 09/23/1940 DOA: 10/26/2021  PCP: Juluis Pitch, MD  Outpatient Specialists: Dr Maryr[facility] neurosurgery Patient coming from: [facility] clinic  I have personally briefly reviewed patient's old medical records in [place].  Chief Concern: Chest pain  HPI: [name] is an 81 year old male with history of hypertension, hyperlipidemia, B12 deficiency, presents emergency department for chief concerns of chest pain.  Vitals in the emergency department showed temperature of 90.5, respiration rate of 18, heart rate 56, blood pressure 143/81, SPO2 of 94% on room air.  Serum sodium 142, potassium 4.0, chloride 108, bicarb 27, BUN of 26, serum creatinine of 1.55, GFR 45, nonfasting blood glucose 119, WBC 6.2, hemoglobin 10.5, platelets of 231.    Troponin was 25.  ED treatment: Aspirin 324 mg p.o. one-time dose  At bedside, he is able to tell me his name, age, current location. He states he was working on his car, getting an air tank, he tried to unload from his car, when he developed chest pain across his chest, this lasted about 10-15 minutes and resolved with rest. He endorses feeling shortness of breath.  He states that he felt like he was going to pass out but he did not.  He denies nausea, vomiting, abdominal pain.   Social history: He denies history of tobacco use, etoh. He is retired and formerly worked for [employer].   Vaccination history: he is vaccinated for covid, 2 doses.  ROS: Constitutional: no weight change, no fever ENT/Mouth: no sore throat, no rhinorrhea Eyes: no eye pain, no vision changes Cardiovascular: + chest pain, + dyspnea,  no edema, no palpitations Respiratory: no cough, no sputum, no wheezing Gastrointestinal: no nausea, no vomiting, no diarrhea, no constipation Genitourinary: no urinary incontinence, no dysuria, no hematuria Musculoskeletal: no arthralgias, no  myalgias Skin: no skin lesions, no pruritus, Neuro: + weakness, no loss of consciousness, no syncope Psych: no anxiety, no depression, no decrease appetite Heme/Lymph: no bruising, no bleeding  ED Course: Discussed with emergency medicine provider, patient requiring hospitalization for chief concerns of chest pain.  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  * Chest pain Assessment & Plan - Given patient's history of hypertension, age, onset of chest pain with exertion, elevated troponin, patient will need to be admitted for chest pain observation - Agree with adding BNP to prior lab collection - Continue to follow troponin level - Complete echo ordered - Continue heparin GTT - Cardiologist, Dr. Corky Sox has been consulted and states he will see the patient   Chart reviewed.   DVT prophylaxis: Heparin GGT Code Status: full code Diet: heart healthy Family Communication: updated daughter over the phone Disposition Plan: Pending clinical course Consults called: Cardiologist, Dr. Corky Sox Admission status: Progressive cardiac, observation, telemetry  Past Medical History:  Diagnosis Date   Arthritis    Cataracts,  both eyes    Hearing loss    left ear   History of kidney stones    Hypertension    Past Surgical History:  Procedure Laterality Date   APPENDECTOMY      COLONOSCOPY WITH PROPOFOL N/A 09/29/2018   Procedure: COLONOSCOPY WITH PROPOFOL;  Surgeon: Lollie Sails, MD;  Location: [facility] ENDOSCOPY;  Service: Endoscopy;  Laterality: N/A;   COLONOSCOPY WITH PROPOFOL N/A 04/15/2019   Procedure: COLONOSCOPY WITH PROPOFOL;  Surgeon: Lollie Sails, MD;  Location: [facility] ENDOSCOPY;  Service: Endoscopy;  Laterality: N/A;   WART FULGURATION N/A 06/10/2019   Procedure: FULGURATION ANAL WART;  Surgeon: Benjamine Sprague, DO;  Location: [facility] ORS;  Service: General;  Laterality: N/A;   Social History:  reports that he has never smoked. He has never used smokeless tobacco. He reports that he does not drink alcohol and does not use drugs.  Allergies  Allergen Reactions   Sulfa Antibiotics Other (See Comments)    Unknown reaction   Family History  Problem Relation Age of Onset   Hypertension Father    Family history: Family history reviewed and pertinent for hypertension in father.  Prior to Admission medications   Medication Sig Start Date End Date Taking? Authorizing Provider  Menthol-Methyl Salicylate (MUSCLE RUB) 10-15 % CREA Apply 1 application topically daily as needed for muscle pain.    [provider]  Naphazoline-Glycerin (CLEAR EYES MAX REDNESS RELIEF OP) Place 2 drops into both eyes daily as needed (dry eyes).    [provider]  vitamin B-12 (CYANOCOBALAMIN) 50 MCG tablet Take 50 mcg by mouth daily.    [provider]   Physical Exam: Vitals:   10/26/21 1850 10/26/21 1851 10/26/21 1900 10/26/21 1910  BP:   (!) 142/100   Pulse: 60  (!) 48 62  Resp: '17  18 17  '$ Temp:      TempSrc:      SpO2: 100%  100% 98%  Weight:  70 kg    Height:       Constitutional: appears age-appropriate, NAD, calm, comfortable Eyes: PERRL, lids and conjunctivae normal ENMT: Mucous membranes are moist. Posterior pharynx clear of any exudate or lesions. Age-appropriate dentition.  Severe hearing loss Neck: normal, supple, no masses, no  thyromegaly Respiratory: clear to auscultation bilaterally, no wheezing, no crackles. Normal respiratory effort. No accessory muscle use.  Cardiovascular: Regular rhythm, bradycardia, no murmurs / rubs / gallops. No extremity edema. 2+ pedal pulses. No carotid bruits.  Abdomen: no tenderness, no masses palpated, no hepatosplenomegaly. Bowel sounds positive.  Musculoskeletal: no clubbing / cyanosis. No joint deformity upper and lower extremities. Good ROM, no contractures, no atrophy. Normal muscle tone.  Skin: no rashes, lesions, ulcers. No induration Neurologic: Sensation intact. Strength 5/5 in all 4.  Psychiatric: Normal judgment and insight. Alert and oriented x 3. Normal mood.   EKG: independently reviewed, showing sinus bradycardia with rate of 50, QTc 384, T wave inversion in leads III.  This T wave inversion is new compared to EKG on 04/08/2021 and 04/09/2021.  Chest x-ray on Admission: I personally reviewed and I agree with radiologist reading as below.  DG Chest Port 1 View  Result Date: 10/26/2021 CLINICAL DATA:  Chest pain EXAM: PORTABLE CHEST 1 VIEW COMPARISON:  04/21/2016 FINDINGS: No focal opacity, pleural effusion, or pneumothorax. Normal cardiomediastinal silhouette. Mild central vascular congestion. Borderline cardiac size. Retrocardiac oval opacity potentially due to hiatal hernia. IMPRESSION: 1. Borderline cardiomegaly with slight central congestion 2. Ovoid retrocardiac opacity, suspect  hiatal hernia, though could correlate with lateral view of the chest. Electronically Signed   By: Donavan Foil M.D.   On: 10/26/2021 17:47    Labs on Admission: I have personally reviewed following labs  CBC: Recent Labs  Lab 10/26/21 1707  WBC 6.2  HGB 10.5*  HCT 33.7*  MCV 90.6  PLT 118   Basic Metabolic Panel: Recent Labs  Lab 10/26/21 1707  NA 142  K 4.0  CL 108  CO2 27  GLUCOSE 119*  BUN 26*  CREATININE 1.55*  CALCIUM 9.2   GFR: Estimated Creatinine Clearance: 33.1  mL/min (A) (by C-G formula based on SCr of 1.55 mg/dL (H)).  Dr. Tobie Poet [facility]  If 7PM-7AM, please contact overnight-coverage provider If 7AM-7PM, please contact day coverage provider [email or web address]  10/26/2021, 7:46 PM

## 2021-10-26 NOTE — ED Notes (Signed)
Pt taken to floor by RN with heparin gtt and cardiac montoring. ?

## 2021-10-27 DIAGNOSIS — I10 Essential (primary) hypertension: Secondary | ICD-10-CM | POA: Diagnosis not present

## 2021-10-27 DIAGNOSIS — R079 Chest pain, unspecified: Secondary | ICD-10-CM | POA: Diagnosis not present

## 2021-10-27 DIAGNOSIS — E782 Mixed hyperlipidemia: Secondary | ICD-10-CM | POA: Diagnosis not present

## 2021-10-27 LAB — BASIC METABOLIC PANEL
Anion gap: 5 (ref 5–15)
BUN: 25 mg/dL — ABNORMAL HIGH (ref 8–23)
CO2: 27 mmol/L (ref 22–32)
Calcium: 8.6 mg/dL — ABNORMAL LOW (ref 8.9–10.3)
Chloride: 107 mmol/L (ref 98–111)
Creatinine, Ser: 1.49 mg/dL — ABNORMAL HIGH (ref 0.61–1.24)
GFR, Estimated: 47 mL/min — ABNORMAL LOW (ref >60)
Glucose, Bld: 90 mg/dL (ref 70–99)
Potassium: 3.8 mmol/L (ref 3.5–5.1)
Sodium: 139 mmol/L (ref 135–145)

## 2021-10-27 LAB — LIPID PANEL
Cholesterol: 208 mg/dL — ABNORMAL HIGH (ref 0–200)
HDL: 71 mg/dL (ref >40)
LDL Cholesterol: 124 mg/dL — ABNORMAL HIGH (ref 0–99)
Total CHOL/HDL Ratio: 2.9 RATIO
Triglycerides: 63 mg/dL (ref <150)
VLDL: 13 mg/dL (ref 0–40)

## 2021-10-27 LAB — HEPARIN LEVEL (UNFRACTIONATED)
Heparin Unfractionated: 0.4 IU/mL (ref 0.30–0.70)
Heparin Unfractionated: 0.36 IU/mL (ref 0.30–0.70)

## 2021-10-27 LAB — CBC
HCT: 28.7 % — ABNORMAL LOW (ref 39.0–52.0)
Hemoglobin: 9.4 g/dL — ABNORMAL LOW (ref 13.0–17.0)
MCH: 28.5 pg (ref 26.0–34.0)
MCHC: 32.8 g/dL (ref 30.0–36.0)
MCV: 87 fL (ref 80.0–100.0)
Platelets: 193 10*3/uL (ref 150–400)
RBC: 3.3 MIL/uL — ABNORMAL LOW (ref 4.22–5.81)
RDW: 13.8 % (ref 11.5–15.5)
WBC: 5.7 10*3/uL (ref 4.0–10.5)
nRBC: 0 % (ref 0.0–0.2)

## 2021-10-27 LAB — TROPONIN I (HIGH SENSITIVITY): Troponin I (High Sensitivity): 32 ng/L — ABNORMAL HIGH (ref <18)

## 2021-10-27 MED ORDER — ASPIRIN EC 81 MG PO TBEC
81.0000 mg | DELAYED_RELEASE_TABLET | Freq: Every day | ORAL | Status: DC
  Administered 2021-10-27 – 2021-10-31 (×4): 81 mg via ORAL
  Filled 2021-10-27 (×5): qty 1

## 2021-10-27 MED ORDER — AMLODIPINE BESYLATE 5 MG PO TABS
5.0000 mg | ORAL_TABLET | Freq: Every day | ORAL | Status: DC
  Administered 2021-10-27 – 2021-10-31 (×5): 5 mg via ORAL
  Filled 2021-10-27 (×5): qty 1

## 2021-10-27 MED ORDER — ATORVASTATIN CALCIUM 80 MG PO TABS
80.0000 mg | ORAL_TABLET | Freq: Every day | ORAL | Status: DC
  Administered 2021-10-27 – 2021-10-31 (×5): 80 mg via ORAL
  Filled 2021-10-27 (×5): qty 1

## 2021-10-27 NOTE — Consult Note (Signed)
ANTICOAGULATION CONSULT NOTE  ? ?Pharmacy Consult for Heparin  ?Indication: chest pain/ACS ? ?Allergies  ?Allergen Reactions  ? Sulfa Antibiotics Other (See Comments)  ?  Unknown reaction  ? ? ?Patient Measurements: ?Height: '5\' 5"'$  (165.1 cm) ?Weight: 70 kg (154 lb 5.2 oz) ?IBW/kg (Calculated) : 61.5 ?Heparin Dosing Weight: 70 kg ? ?Vital Signs: ?Temp: 97.9 ?F (36.6 ?C) (03/11 1132) ?BP: 124/70 (03/11 1132) ?Pulse Rate: 45 (03/11 1132) ? ?Labs: ?Recent Labs  ?  10/26/21 ?1707 10/26/21 ?1840 10/27/21 ?2683 10/27/21 ?0902 10/27/21 ?1245  ?HGB 10.5*  --  9.4*  --   --   ?HCT 33.7*  --  28.7*  --   --   ?PLT 231  --  193  --   --   ?APTT  --  27  --   --   --   ?HEPARINUNFRC  --   --  0.40  --  0.36  ?CREATININE 1.55*  --  1.49*  --   --   ?TROPONINIHS 25* 58*  --  32*  --   ? ? ? ?Estimated Creatinine Clearance: 34.4 mL/min (A) (by C-G formula based on SCr of 1.49 mg/dL (H)). ? ? ?Medical History: ?Past Medical History:  ?Diagnosis Date  ? Arthritis   ? Cataracts, both eyes   ? 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 History of kidney stones   ? Hypertension   ? ? ?Medications:  ?Medications Prior to Admission  ?Medication Sig Dispense Refill Last Dose  ? lisinopril-hydrochlorothiazide (ZESTORETIC) 20-12.5 MG tablet Take 1 tablet by mouth daily.   10/25/2021 at Unknown  ? tamsulosin (FLOMAX) 0.4 MG CAPS capsule Take 0.4 mg by mouth daily.   10/25/2021  ? No anticoagulants documented in medical history from PCP or from pharmacy refill records. ? ?Assessment: ?81yrold male reported to PCP for evaluation of severe chest pain as result of heavy exertion.  Also reported dizziness.  Has noted some SOB on exertion but never had this type pain. ? ?Goal of Therapy:  ?Heparin level 0.3-0.7 units/ml ?Monitor platelets by anticoagulation protocol: Yes ? ?3/11 0514 HL 0.40, therapeutic x 1 ?3/11 1245 HL 0.36, ?  ?Plan:  ?Heparin level is therapeutic. Will continue heparin infusion at 850 units/hr. Will recheck heparin level and CBC with AM labs.   ? ?KEleonore Chiquito PharmD.  ?10/27/2021 ?1:27 PM ? ? ? ? ?

## 2021-10-27 NOTE — Consult Note (Signed)
[facility] Cardiology ? ?CARDIOLOGY CONSULT NOTE  ?Patient ID: ?Juan [name] ?MRN: [id number] ?DOB/AGE: 03/21/1941 81 y.o. ? ?Admit date: 10/26/2021 ?Referring Physician Amy Cox ?Primary Physician Juluis Pitch, MD ?Primary Cardiologist None ?Reason for Consultation Elevated troponin ? ?HPI:  ?Juan [name] is an 81 year old male with a history of hypertension, CKD, hyperlipidemia, B12 deficiency who presents with chest pain and was discovered to have a mildly elevated troponin consistent with NSTEMI. ? ?He is hard of hearing so history is difficult to obtain. He says that for the last 4 weeks his PCP wanted him to get a colonoscopy. He is not a good historian.  ? ?He says that the skin on his chest is red, so he used a vibrator on it with improvement. He denies having any chest pain pain otherwise.  ? ?He was in his usual state of health until yesterday when he was working on his car and tried to unload a heavy tank from it at which time he developed chest pain that lasted about 10 to 15 minutes and resolving with rest.  He had some shortness of breath associated with it, as well as a feeling as though he was going to pass out.  ? ?He says that 2 weeks ago he had an episode of hematochezia which he though was hemorrhoidal, but no other evidence of blood loss.  ? ?On arrival to the emergency department he was in sinus bradycardia with a rate in the 40s to 50s, with mildly elevated blood pressure.  He was saturating well on room air.  His labs are notable for a troponin of 25 increasing to 58, and a BNP of 129.  Notably his hemoglobin is decreased from 13.4 in August to 9.4 currently. ? ?Social ?- Never smoker ? ?Review of systems complete and found to be negative unless listed above  ? ? ? ?Past Medical History:  ?Diagnosis Date  ? Arthritis   ? Cataracts, both eyes   ? 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 History of kidney stones   ? Hypertension   ?  ?Past Surgical History:  ?Procedure Laterality Date  ? APPENDECTOMY    ?  COLONOSCOPY WITH PROPOFOL N/A 09/29/2018  ? Procedure: COLONOSCOPY WITH PROPOFOL;  Surgeon: Lollie Sails, MD;  Location: [facility] ENDOSCOPY;  Service: Endoscopy;  Laterality: N/A;  ? COLONOSCOPY WITH PROPOFOL N/A 04/15/2019  ? Procedure: COLONOSCOPY WITH PROPOFOL;  Surgeon: Lollie Sails, MD;  Location: [facility] ENDOSCOPY;  Service: Endoscopy;  Laterality: N/A;  ? WART FULGURATION N/A 06/10/2019  ? Procedure: FULGURATION ANAL WART;  Surgeon: Benjamine Sprague, DO;  Location: [facility] ORS;  Service: General;  Laterality: N/A;  ?  ?Medications Prior to Admission  ?Medication Sig Dispense Refill Last Dose  ? lisinopril-hydrochlorothiazide (ZESTORETIC) 20-12.5 MG tablet Take 1 tablet by mouth daily.   10/25/2021 at Unknown  ? tamsulosin (FLOMAX) 0.4 MG CAPS capsule Take 0.4 mg by mouth daily.   10/25/2021  ? ?Social History  ? ?Socioeconomic History  ? Marital status: Divorced  ?  Spouse name: Not on file  ? Number of children: Not on file  ? Years of education: Not on file  ? Highest education level: Not on file  ?Occupational History  ? Not on file  ?Tobacco Use  ? Smoking status: Never  ? Smokeless tobacco: Never  ?Vaping Use  ? Vaping Use: Never used  ?Substance and Sexual Activity  ? Alcohol use: No  ? Drug use: No  ? Sexual activity:  Not on file  ?Other Topics Concern  ? Not on file  ?Social History Narrative  ? Not on file  ? ?Social Determinants of Health  ? ?Financial Resource Strain: Not on file  ?Food Insecurity: Not on file  ?Transportation Needs: Not on file  ?Physical Activity: Not on file  ?Stress: Not on file  ?Social Connections: Not on file  ?Intimate Partner Violence: Not on file  ?  ?Family History  ?Problem Relation Age of Onset  ? Hypertension Father   ?  ? ? ?Review of systems complete and found to be negative unless listed above  ? ? ? ? ?PHYSICAL EXAM ? ?General: Well developed, well nourished, in no acute distress ?HEENT:  Normocephalic and atramatic ?Neck:  No JVD.  ?Lungs: Clear bilaterally to  auscultation and percussion. ?Heart: HRRR . Normal S1 and S2 without gallops or murmurs.  ?Abdomen: Bowel sounds are positive, abdomen soft and non-tender  ?Msk:  Back normal, normal gait. Normal strength and tone for age. ?Extremities: No clubbing, cyanosis or edema.   ?Neuro: Alert and oriented X 3. ?Psych:  Good affect, responds appropriately ? ?Labs: ?  ?Lab Results  ?Component Value Date  ? WBC 5.7 10/27/2021  ? HGB 9.4 (L) 10/27/2021  ? HCT 28.7 (L) 10/27/2021  ? MCV 87.0 10/27/2021  ? PLT 193 10/27/2021  ?  ?Recent Labs  ?Lab 10/27/21 ?1761  ?NA 139  ?K 3.8  ?CL 107  ?CO2 27  ?BUN 25*  ?CREATININE 1.49*  ?CALCIUM 8.6*  ?GLUCOSE 90  ? ?Lab Results  ?Component Value Date  ? TROPONINI <0.03 04/22/2016  ?  ?Lab Results  ?Component Value Date  ? CHOL 208 (H) 10/27/2021  ? ?Lab Results  ?Component Value Date  ? HDL 71 10/27/2021  ? ?Lab Results  ?Component Value Date  ? LDLCALC 124 (H) 10/27/2021  ? ?Lab Results  ?Component Value Date  ? TRIG 63 10/27/2021  ? ?Lab Results  ?Component Value Date  ? CHOLHDL 2.9 10/27/2021  ? ?No results found for: LDLDIRECT  ?  ?Radiology: [facility] Chest Port 1 View ? ?Result Date: 10/26/2021 ?CLINICAL DATA:  Chest pain EXAM: PORTABLE CHEST 1 VIEW COMPARISON:  04/21/2016 FINDINGS: No focal opacity, pleural effusion, or pneumothorax. Normal cardiomediastinal silhouette. Mild central vascular congestion. Borderline cardiac size. Retrocardiac oval opacity potentially due to hiatal hernia. IMPRESSION: 1. Borderline cardiomegaly with slight central congestion 2. Ovoid retrocardiac opacity, suspect hiatal hernia, though could correlate with lateral view of the chest. Electronically Signed   By: Donavan Foil M.D.   On: 10/26/2021 17:47   ? ?EKG: Sinus bradycardia without acute ischemic changes. ? ?ASSESSMENT AND PLAN:  ?Juan [name] is an 81 year old male with a history of hypertension, CKD, hyperlipidemia, B12 deficiency who presents with chest pain and was discovered to have a mildly  elevated troponin consistent with NSTEMI.  He was additionally found to be anemic with a hemoglobin of 9.4 down from 13.4 previously. ? ?# Chest Pain, elevated troponin ?#Anemia ?Presents with what sounds to be fairly typical angina with chest pain while working on his car that resolved after 15 minutes.  His EKG is nonischemic, but his troponin is mildly elevated to 50.  I am concerned about his new anemia with hemoglobin of 9.4, as this could potentially preclude the safety of placing him on dual antiplatelet therapy. ?- s/p aspirin 324 mg. Aspirin 81 mg indefinitely ?- Agree with heparin infusion ?- start atorvastatin 80 mg ?- start amlodipine for antianginal effect  and BP ?- No beta blocker d/t bradycardia ?- Please further evaluate his anemia ?- Echocardiogram complete ordered ?- Will consider ischemic evaluation pending above ? ?#Chronic kidney disease ?Creatinine is currently approximately 1.5 which is his baseline, but recently his creatinine has been elevated to above 2. ? ?# Hypertension ?BP is mildly elevated. Primary team is holding home lisinopril- HCTZ 20-12.5 ? ?Signed: ?Andrez Grime MD ?10/27/2021, 8:18 AM ? ?

## 2021-10-27 NOTE — Plan of Care (Signed)

## 2021-10-27 NOTE — Progress Notes (Signed)
?PROGRESS NOTE ? ? ? ?Juan [name]  YBW:[id number] DOB: [birth year] DOA: 10/26/2021 ?PCP: Juluis Pitch, MD  ? ? ?Assessment & Plan: ?  ?Principal Problem: ?  Chest pain ?Active Problems: ?  B12 deficiency ?  Essential hypertension ?  Hyperlipidemia ?  Elevated troponin ?  Stage 3a chronic kidney disease (CKD) ([place]) ? ? ?Chest pain: troponins are minimally elevated. Echo pending. Continue on IV heparin as per cardio. Continue on tele. Cardio following and recs apprec  ? ?HTN: hold home dose of lisinopril, HCTZ ? ?Likely CKDIIIa: Cr is labile. Avoid nephrotoxic meds ? ?HLD: was not taking a statin as per med rec but started on statin by cardio ? ?Normocytic anemia: pt denied any hematemesis, hemoptysis, hematuria, bloody or black stools to me but admitted to having bloody stools a few weeks ago to cardio evidently. Will monitor H&H and if continues to trend down will consult GI. Will check iron panel  ? ? ?DVT prophylaxis: IV heparin  ?Code Status: full ?Family Communication: discussed pt's care w/ pt's family at bedside and answered their questions  ?Disposition Plan: likely d/c back home  ? ?Level of care: Progressive ? ?Status is: Observation ?The patient remains OBS appropriate and will d/c before 2 midnights. ? ?Consultants:  ?Cardio  ? ?Procedures: ? ?Antimicrobials:  ? ? ?Subjective: ?Pt c/o fatigue.  ? ?Objective: ?Vitals:  ? 10/26/21 2010 10/27/21 0020 10/27/21 0423 10/27/21 7681  ?BP: (!) 157/88 (!) 148/70 123/75 (!) 151/83  ?Pulse: (!) 48 (!) 57 61 (!) 50  ?Resp: '20 16 18 18  '$ ?Temp: 97.7 ?F (36.5 ?C) 97.7 ?F (36.5 ?C) 97.8 ?F (36.6 ?C) (!) 97.5 ?F (36.4 ?C)  ?TempSrc: Oral     ?SpO2: 99% 96% 100% 100%  ?Weight:      ?Height:      ? ? ?Intake/Output Summary (Last 24 hours) at 10/27/2021 0742 ?Last data filed at 10/27/2021 1572 ?Gross per 24 hour  ?Intake 104.42 ml  ?Output 200 ml  ?Net -95.58 ml  ? ?Filed Weights  ? 10/26/21 1851  ?Weight: 70 kg  ? ? ?Examination: ? ?General exam: Appears calm and  comfortable. Hard of hearing  ?Respiratory system: Clear to auscultation. Respiratory effort normal. ?Cardiovascular system: S1 & S2 +. No rubs, gallops or clicks.  ?Gastrointestinal system: Abdomen is nondistended, soft and nontender. Normal bowel sounds heard. ?Central nervous system: Alert and oriented. Moves all extremities  ?Psychiatry: Judgement and insight appear at baseline. Flat mood and affect   ? ? ? ?Data Reviewed: I have personally reviewed following labs and imaging studies ? ?CBC: ?Recent Labs  ?Lab 10/26/21 ?1707 10/27/21 ?6203  ?WBC 6.2 5.7  ?HGB 10.5* 9.4*  ?HCT 33.7* 28.7*  ?MCV 90.6 87.0  ?PLT 231 193  ? ?Basic Metabolic Panel: ?Recent Labs  ?Lab 10/26/21 ?1707 10/27/21 ?5597  ?NA 142 139  ?K 4.0 3.8  ?CL 108 107  ?CO2 27 27  ?GLUCOSE 119* 90  ?BUN 26* 25*  ?CREATININE 1.55* 1.49*  ?CALCIUM 9.2 8.6*  ? ?GFR: ?Estimated Creatinine Clearance: 34.4 mL/min (A) (by C-G formula based on SCr of 1.49 mg/dL (H)). ?Liver Function Tests: ?No results for input(s): AST, ALT, ALKPHOS, BILITOT, PROT, ALBUMIN in the last 168 hours. ?No results for input(s): LIPASE, AMYLASE in the last 168 hours. ?No results for input(s): AMMONIA in the last 168 hours. ?Coagulation Profile: ?No results for input(s): INR, PROTIME in the last 168 hours. ?Cardiac Enzymes: ?No results for input(s): CKTOTAL, CKMB, CKMBINDEX, TROPONINI in the  last 168 hours. ?BNP (last 3 results) ?No results for input(s): PROBNP in the last 8760 hours. ?HbA1C: ?No results for input(s): HGBA1C in the last 72 hours. ?CBG: ?No results for input(s): GLUCAP in the last 168 hours. ?Lipid Profile: ?Recent Labs  ?  10/27/21 ?9563  ?CHOL 208*  ?HDL 71  ?[place] 124*  ?TRIG 63  ?CHOLHDL 2.9  ? ?Thyroid Function Tests: ?No results for input(s): TSH, T4TOTAL, FREET4, T3FREE, THYROIDAB in the last 72 hours. ?Anemia Panel: ?No results for input(s): VITAMINB12, FOLATE, FERRITIN, TIBC, IRON, RETICCTPCT in the last 72 hours. ?Sepsis Labs: ?No results for input(s):  PROCALCITON, LATICACIDVEN in the last 168 hours. ? ?Recent Results (from the past 240 hour(s))  ?Resp Panel by RT-PCR (Flu A&B, Covid) Nasopharyngeal Swab     Status: None  ? Collection Time: 10/26/21  6:40 PM  ? Specimen: Nasopharyngeal Swab; Nasopharyngeal(NP) swabs in vial transport medium  ?Result Value Ref Range Status  ? SARS Coronavirus 2 by RT PCR NEGATIVE NEGATIVE Final  ?  Comment: (NOTE) ?SARS-CoV-2 target nucleic acids are NOT DETECTED. ? ?The SARS-CoV-2 RNA is generally detectable in upper respiratory ?specimens during the acute phase of infection. The lowest ?concentration of SARS-CoV-2 viral copies this assay can detect is ?138 copies/mL. A negative result does not preclude SARS-Cov-2 ?infection and should not be used as the sole basis for treatment or ?other patient management decisions. A negative result may occur with  ?improper specimen collection/handling, submission of specimen other ?than nasopharyngeal swab, presence of viral mutation(s) within the ?areas targeted by this assay, and inadequate number of viral ?copies(<138 copies/mL). A negative result must be combined with ?clinical observations, patient history, and epidemiological ?information. The expected result is Negative. ? ?Fact Sheet for Patients:  ?EntrepreneurPulse.com.au ? ?Fact Sheet for Healthcare Providers:  ?IncredibleEmployment.be ? ?This test is no t yet approved or cleared by the [place] FDA and  ?has been authorized for detection and/or diagnosis of SARS-CoV-2 by ?FDA under an Emergency Use Authorization (EUA). This EUA will remain  ?in effect (meaning this test can be used) for the duration of the ?COVID-19 declaration under Section 564(b)(1) of the Act, 21 ?U.S.C.section 360bbb-3(b)(1), unless the authorization is terminated  ?or revoked sooner.  ? ? ?  ? Influenza A by PCR NEGATIVE NEGATIVE Final  ? Influenza B by PCR NEGATIVE NEGATIVE Final  ?  Comment: (NOTE) ?The Xpert Xpress  SARS-CoV-2/FLU/RSV plus assay is intended as an aid ?in the diagnosis of influenza from Nasopharyngeal swab specimens and ?should not be used as a sole basis for treatment. Nasal washings and ?aspirates are unacceptable for Xpert Xpress SARS-CoV-2/FLU/RSV ?testing. ? ?Fact Sheet for Patients: ?EntrepreneurPulse.com.au ? ?Fact Sheet for Healthcare Providers: ?IncredibleEmployment.be ? ?This test is not yet approved or cleared by the [place] FDA and ?has been authorized for detection and/or diagnosis of SARS-CoV-2 by ?FDA under an Emergency Use Authorization (EUA). This EUA will remain ?in effect (meaning this test can be used) for the duration of the ?COVID-19 declaration under Section 564(b)(1) of the Act, 21 U.S.C. ?section 360bbb-3(b)(1), unless the authorization is terminated or ?revoked. ? ?Performed at [facility], [place], ?[place] [address] ?  ?  ? ? ? ? ? ?Radiology Studies: ?DG Chest Port 1 View ? ?Result Date: 10/26/2021 ?CLINICAL DATA:  Chest pain EXAM: PORTABLE CHEST 1 VIEW COMPARISON:  04/21/2016 FINDINGS: No focal opacity, pleural effusion, or pneumothorax. Normal cardiomediastinal silhouette. Mild central vascular congestion. Borderline cardiac size. Retrocardiac oval opacity potentially  due to hiatal hernia. IMPRESSION: 1. Borderline cardiomegaly with slight central congestion 2. Ovoid retrocardiac opacity, suspect hiatal hernia, though could correlate with lateral view of the chest. Electronically Signed   By: Donavan Foil M.D.   On: 10/26/2021 17:47   ? ? ? ? ? ?Scheduled Meds: ?Continuous Infusions: ? heparin 850 Units/hr (10/27/21 0304)  ? ? ? LOS: 0 days  ? ? ?Time spent: 35 mins  ? ? ? ?Wyvonnia Dusky, MD ?[facility] ?Pager [phone] ? ?If 7PM-7AM, please contact night-coverage ?10/27/2021, 7:42 AM  ? ?

## 2021-10-27 NOTE — Consult Note (Signed)
ANTICOAGULATION CONSULT NOTE  ? ?Pharmacy Consult for heparin infusion dosing ?Indication: chest pain/ACS ? ?Allergies  ?Allergen Reactions  ? Sulfa Antibiotics Other (See Comments)  ?  Unknown reaction  ? ? ?Patient Measurements: ?Height: '5\' 5"'$  (165.1 cm) ?Weight: 70 kg (154 lb 5.2 oz) ?IBW/kg (Calculated) : 61.5 ?Heparin Dosing Weight: 70 kg ? ?Vital Signs: ?Temp: 97.8 ?F (36.6 ?C) (03/11 0423) ?Temp Source: Oral (03/10 2010) ?BP: 123/75 (03/11 0423) ?Pulse Rate: 61 (03/11 0423) ? ?Labs: ?Recent Labs  ?  10/26/21 ?1707 10/26/21 ?1840 10/27/21 ?6578  ?HGB 10.5*  --  9.4*  ?HCT 33.7*  --  28.7*  ?PLT 231  --  193  ?APTT  --  27  --   ?HEPARINUNFRC  --   --  0.40  ?CREATININE 1.55*  --  1.49*  ?TROPONINIHS 25* 58*  --   ? ? ? ?Estimated Creatinine Clearance: 34.4 mL/min (A) (by C-G formula based on SCr of 1.49 mg/dL (H)). ? ? ?Medical History: ?Past Medical History:  ?Diagnosis Date  ? Arthritis   ? Cataracts, both eyes   ? 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 History of kidney stones   ? Hypertension   ? ? ?Medications:  ?Medications Prior to Admission  ?Medication Sig Dispense Refill Last Dose  ? lisinopril-hydrochlorothiazide (ZESTORETIC) 20-12.5 MG tablet Take 1 tablet by mouth daily.   10/25/2021 at Unknown  ? tamsulosin (FLOMAX) 0.4 MG CAPS capsule Take 0.4 mg by mouth daily.   10/25/2021  ? No anticoagulants documented in medical history from PCP or from pharmacy refill records. ? ?Assessment: ?81yrold male reported to PCP for evaluation of severe chest pain as result of heavy exertion.  Also reported dizziness.  Has noted some SOB on exertion but never had this type pain. ? ?Goal of Therapy:  ?Heparin level 0.3-0.7 units/ml ?Monitor platelets by anticoagulation protocol: Yes ? ?3/11 0514 HL 0.40, therapeutic x 1 ?  ?Plan:  ?Continue heparin infusion 850 units/hr. ?Recheck HL in 8 hrs to confirm. ?CBC daily while on heparin ? ?NRenda Rolls PharmD, MBA ?10/27/2021 ?6:55 AM ? ? ? ? ?

## 2021-10-28 ENCOUNTER — Observation Stay
Admit: 2021-10-28 | Discharge: 2021-10-28 | Disposition: A | Discharge status: Home / Self Care | Payer: Medicare Other | Attending: Internal Medicine | Admitting: Internal Medicine

## 2021-10-28 DIAGNOSIS — D509 Iron deficiency anemia, unspecified: Secondary | ICD-10-CM | POA: Diagnosis present

## 2021-10-28 DIAGNOSIS — N1831 Chronic kidney disease, stage 3a: Secondary | ICD-10-CM | POA: Diagnosis present

## 2021-10-28 DIAGNOSIS — Z79899 Other long term (current) drug therapy: Secondary | ICD-10-CM | POA: Diagnosis not present

## 2021-10-28 DIAGNOSIS — I214 Non-ST elevation (NSTEMI) myocardial infarction: Secondary | ICD-10-CM | POA: Diagnosis present

## 2021-10-28 DIAGNOSIS — E538 Deficiency of other specified B group vitamins: Secondary | ICD-10-CM | POA: Diagnosis present

## 2021-10-28 DIAGNOSIS — Z882 Allergy status to sulfonamides status: Secondary | ICD-10-CM | POA: Diagnosis not present

## 2021-10-28 DIAGNOSIS — M1990 Unspecified osteoarthritis, unspecified site: Secondary | ICD-10-CM | POA: Diagnosis present

## 2021-10-28 DIAGNOSIS — Z8249 Family history of ischemic heart disease and other diseases of the circulatory system: Secondary | ICD-10-CM | POA: Diagnosis not present

## 2021-10-28 DIAGNOSIS — H9192 Unspecified hearing loss, left ear: Secondary | ICD-10-CM | POA: Diagnosis present

## 2021-10-28 DIAGNOSIS — D638 Anemia in other chronic diseases classified elsewhere: Secondary | ICD-10-CM | POA: Diagnosis not present

## 2021-10-28 DIAGNOSIS — E785 Hyperlipidemia, unspecified: Secondary | ICD-10-CM | POA: Diagnosis present

## 2021-10-28 DIAGNOSIS — R001 Bradycardia, unspecified: Secondary | ICD-10-CM | POA: Diagnosis present

## 2021-10-28 DIAGNOSIS — I129 Hypertensive chronic kidney disease with stage 1 through stage 4 chronic kidney disease, or unspecified chronic kidney disease: Secondary | ICD-10-CM | POA: Diagnosis present

## 2021-10-28 DIAGNOSIS — Z20822 Contact with and (suspected) exposure to covid-19: Secondary | ICD-10-CM | POA: Diagnosis present

## 2021-10-28 DIAGNOSIS — R079 Chest pain, unspecified: Secondary | ICD-10-CM | POA: Diagnosis not present

## 2021-10-28 DIAGNOSIS — I10 Essential (primary) hypertension: Secondary | ICD-10-CM | POA: Diagnosis not present

## 2021-10-28 DIAGNOSIS — I25118 Atherosclerotic heart disease of native coronary artery with other forms of angina pectoris: Secondary | ICD-10-CM | POA: Diagnosis present

## 2021-10-28 LAB — ECHOCARDIOGRAM COMPLETE
AV Peak grad: 7.5 mmHg
Ao pk vel: 1.37 m/s
Area-P 1/2: 3.65 cm2
Height: 65 in
S' Lateral: 3.1 cm
Weight: 2469.15 oz

## 2021-10-28 LAB — BASIC METABOLIC PANEL
Anion gap: 6 (ref 5–15)
BUN: 29 mg/dL — ABNORMAL HIGH (ref 8–23)
CO2: 25 mmol/L (ref 22–32)
Calcium: 9.1 mg/dL (ref 8.9–10.3)
Chloride: 109 mmol/L (ref 98–111)
Creatinine, Ser: 1.64 mg/dL — ABNORMAL HIGH (ref 0.61–1.24)
GFR, Estimated: 42 mL/min — ABNORMAL LOW (ref >60)
Glucose, Bld: 101 mg/dL — ABNORMAL HIGH (ref 70–99)
Potassium: 4.2 mmol/L (ref 3.5–5.1)
Sodium: 140 mmol/L (ref 135–145)

## 2021-10-28 LAB — IRON AND TIBC
Iron: 32 ug/dL — ABNORMAL LOW (ref 45–182)
Saturation Ratios: 7 % — ABNORMAL LOW (ref 17.9–39.5)
TIBC: 462 ug/dL — ABNORMAL HIGH (ref 250–450)
UIBC: 430 ug/dL

## 2021-10-28 LAB — CBC
HCT: 31.7 % — ABNORMAL LOW (ref 39.0–52.0)
Hemoglobin: 10.4 g/dL — ABNORMAL LOW (ref 13.0–17.0)
MCH: 28.6 pg (ref 26.0–34.0)
MCHC: 32.8 g/dL (ref 30.0–36.0)
MCV: 87.1 fL (ref 80.0–100.0)
Platelets: 210 10*3/uL (ref 150–400)
RBC: 3.64 MIL/uL — ABNORMAL LOW (ref 4.22–5.81)
RDW: 13.7 % (ref 11.5–15.5)
WBC: 5.8 10*3/uL (ref 4.0–10.5)
nRBC: 0 % (ref 0.0–0.2)

## 2021-10-28 LAB — HEPARIN LEVEL (UNFRACTIONATED)
Heparin Unfractionated: 0.29 IU/mL — ABNORMAL LOW (ref 0.30–0.70)
Heparin Unfractionated: 0.52 IU/mL (ref 0.30–0.70)

## 2021-10-28 LAB — FERRITIN: Ferritin: 9 ng/mL — ABNORMAL LOW (ref 24–336)

## 2021-10-28 MED ORDER — FERROUS GLUCONATE 324 (38 FE) MG PO TABS
324.0000 mg | ORAL_TABLET | Freq: Every day | ORAL | Status: DC
  Administered 2021-10-28 – 2021-10-31 (×3): 324 mg via ORAL
  Filled 2021-10-28 (×4): qty 1

## 2021-10-28 MED ORDER — HEPARIN BOLUS VIA INFUSION
1000.0000 [IU] | Freq: Once | INTRAVENOUS | Status: AC
  Administered 2021-10-28: 1000 [IU] via INTRAVENOUS
  Filled 2021-10-28: qty 1000

## 2021-10-28 MED ORDER — MELATONIN 5 MG PO TABS: 2.5000 mg | ORAL_TABLET | Freq: Every evening | ORAL | Status: DC | PRN

## 2021-10-28 NOTE — Consult Note (Signed)
ANTICOAGULATION CONSULT NOTE  ? ?Pharmacy Consult for Heparin  ?Indication: chest pain/ACS ? ?Allergies  ?Allergen Reactions  ? Sulfa Antibiotics Other (See Comments)  ?  Unknown reaction  ? ? ?Patient Measurements: ?Height: '5\' 5"'$  (165.1 cm) ?Weight: 70 kg (154 lb 5.2 oz) ?IBW/kg (Calculated) : 61.5 ?Heparin Dosing Weight: 70 kg ? ?Vital Signs: ?Temp: 97.6 ?F (36.4 ?C) (03/12 0501) ?Temp Source: Oral (03/11 2343) ?BP: 129/69 (03/12 0501) ?Pulse Rate: 56 (03/12 0501) ? ?Labs: ?Recent Labs  ?  10/26/21 ?1707 10/26/21 ?1840 10/27/21 ?8242 10/27/21 ?0902 10/27/21 ?1245 10/28/21 ?3536  ?HGB 10.5*  --  9.4*  --   --  10.4*  ?HCT 33.7*  --  28.7*  --   --  31.7*  ?PLT 231  --  193  --   --  210  ?APTT  --  27  --   --   --   --   ?HEPARINUNFRC  --   --  0.40  --  0.36 0.29*  ?CREATININE 1.55*  --  1.49*  --   --   --   ?TROPONINIHS 25* 58*  --  32*  --   --   ? ? ? ?Estimated Creatinine Clearance: 34.4 mL/min (A) (by C-G formula based on SCr of 1.49 mg/dL (H)). ? ? ?Medical History: ?Past Medical History:  ?Diagnosis Date  ? Arthritis   ? Cataracts, both eyes   ? 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 History of kidney stones   ? Hypertension   ? ? ?Medications:  ?Medications Prior to Admission  ?Medication Sig Dispense Refill Last Dose  ? lisinopril-hydrochlorothiazide (ZESTORETIC) 20-12.5 MG tablet Take 1 tablet by mouth daily.   10/25/2021 at Unknown  ? tamsulosin (FLOMAX) 0.4 MG CAPS capsule Take 0.4 mg by mouth daily.   10/25/2021  ? No anticoagulants documented in medical history from PCP or from pharmacy refill records. ? ?Assessment: ?81yrold male reported to PCP for evaluation of severe chest pain as result of heavy exertion.  Also reported dizziness.  Has noted some SOB on exertion but never had this type pain. ? ?Goal of Therapy:  ?Heparin level 0.3-0.7 units/ml ?Monitor platelets by anticoagulation protocol: Yes ? ?3/11 0514 HL 0.40, therapeutic x 1 ?3/11 1245 HL 0.36, therapeutic x 2 ?3/12 0521 HL 0.29, subtherapeutic ?   ?Plan:  ?Heparin level is subtherapeutic. Will bolus with Heparin 1000 units and increase heparin infusion to 1000 units/hr. Will recheck heparin level 8 hours after rate increase and CBC with AM labs.  ? ?LPaulina Fusi PharmD, BCPS ?10/28/2021 ?6:15 AM ? ? ? ? ? ?

## 2021-10-28 NOTE — Progress Notes (Addendum)
?PROGRESS NOTE ? ? ? ?Juan [name]  LGX:[id number] DOB: 1941-08-18 DOA: 10/26/2021 ?PCP: Juluis Pitch, MD  ? ? ?Assessment & Plan: ?  ?Principal Problem: ?  Chest pain ?Active Problems: ?  B12 deficiency ?  Essential hypertension ?  Hyperlipidemia ?  Elevated troponin ?  Stage 3a chronic kidney disease (CKD) ([place]) ? ? ?Chest pain: troponins are minimally elevated. Echo pending. Cardiac stress test tomorrow as per cardio. No chest pain currently.  Cardio following and recs apprec  ? ?HTN: hold home dose of HCTZ, lisinopril  ? ?Likely CKDIIIa: Cr is trending up from day prior  ? ?HLD: started on statin and will continue as per cardio  ? ?ACD: H&H are labile. Likely secondary to CKD. Started on iron supplements. Pt denied any hematemesis, hemoptysis, hematuria, bloody or black stools to me but admitted to having bloody stools a few weeks ago to cardio evidently. H&H are trending up today  ? ? ?DVT prophylaxis: IV heparin  ?Code Status: full ?Family Communication: discussed pt's care w/ pt's family at bedside and answered their questions  ?Disposition Plan: likely d/c back home  ? ?Level of care: Progressive ? ?Status is: Observation ?The patient remains OBS appropriate and will d/c before 2 midnights. ? ?Consultants:  ?Cardio  ? ?Procedures: ? ?Antimicrobials:  ? ? ?Subjective: ?Pt c/o malaise.  ? ?Objective: ?Vitals:  ? 10/27/21 2000 10/27/21 2343 10/28/21 0501 10/28/21 0801  ?BP: (!) 144/84 (!) 143/70 129/69 (!) 145/73  ?Pulse: (!) 47 61 (!) 56 (!) 50  ?Resp: '17 17 17 16  '$ ?Temp: 98 ?F (36.7 ?C) 98.2 ?F (36.8 ?C) 97.6 ?F (36.4 ?C) 97.7 ?F (36.5 ?C)  ?TempSrc: Oral Oral    ?SpO2: 99% 100% 99% 100%  ?Weight:      ?Height:      ? ? ?Intake/Output Summary (Last 24 hours) at 10/28/2021 0803 ?Last data filed at 10/27/2021 2308 ?Gross per 24 hour  ?Intake 530.59 ml  ?Output 0 ml  ?Net 530.59 ml  ? ?Filed Weights  ? 10/26/21 1851  ?Weight: 70 kg  ? ? ?Examination: ? ?General exam: Appears comfortable. Hard of hearing   ?Respiratory system: clear breath sounds b/l ?Cardiovascular system: S1/S2+. No rubs or clicks  ?Gastrointestinal system: Abd is soft, NT, ND & hypoactive bowel sounds. ?Central nervous system: alert and awake. Moves all extremities  ?Psychiatry: Judgement and insight appears at baseline. Flat mood and affect   ? ? ? ?Data Reviewed: I have personally reviewed following labs and imaging studies ? ?CBC: ?Recent Labs  ?Lab 10/26/21 ?1707 10/27/21 ?8144 10/28/21 ?8185  ?WBC 6.2 5.7 5.8  ?HGB 10.5* 9.4* 10.4*  ?HCT 33.7* 28.7* 31.7*  ?MCV 90.6 87.0 87.1  ?PLT 231 193 210  ? ?Basic Metabolic Panel: ?Recent Labs  ?Lab 10/26/21 ?1707 10/27/21 ?6314 10/28/21 ?9702  ?NA 142 139 140  ?K 4.0 3.8 4.2  ?CL 108 107 109  ?CO2 '27 27 25  '$ ?GLUCOSE 119* 90 101*  ?BUN 26* 25* 29*  ?CREATININE 1.55* 1.49* 1.64*  ?CALCIUM 9.2 8.6* 9.1  ? ?GFR: ?Estimated Creatinine Clearance: 31.3 mL/min (A) (by C-G formula based on SCr of 1.64 mg/dL (H)). ?Liver Function Tests: ?No results for input(s): AST, ALT, ALKPHOS, BILITOT, PROT, ALBUMIN in the last 168 hours. ?No results for input(s): LIPASE, AMYLASE in the last 168 hours. ?No results for input(s): AMMONIA in the last 168 hours. ?Coagulation Profile: ?No results for input(s): INR, PROTIME in the last 168 hours. ?Cardiac Enzymes: ?No results for input(s):  CKTOTAL, CKMB, CKMBINDEX, TROPONINI in the last 168 hours. ?BNP (last 3 results) ?No results for input(s): PROBNP in the last 8760 hours. ?HbA1C: ?No results for input(s): HGBA1C in the last 72 hours. ?CBG: ?No results for input(s): GLUCAP in the last 168 hours. ?Lipid Profile: ?Recent Labs  ?  10/27/21 ?2683  ?CHOL 208*  ?HDL 71  ?[place] 124*  ?TRIG 63  ?CHOLHDL 2.9  ? ?Thyroid Function Tests: ?No results for input(s): TSH, T4TOTAL, FREET4, T3FREE, THYROIDAB in the last 72 hours. ?Anemia Panel: ?Recent Labs  ?  10/28/21 ?4196  ?FERRITIN 9*  ?TIBC 462*  ?IRON 32*  ? ?Sepsis Labs: ?No results for input(s): PROCALCITON, LATICACIDVEN in the last  168 hours. ? ?Recent Results (from the past 240 hour(s))  ?Resp Panel by RT-PCR (Flu A&B, Covid) Nasopharyngeal Swab     Status: None  ? Collection Time: 10/26/21  6:40 PM  ? Specimen: Nasopharyngeal Swab; Nasopharyngeal(NP) swabs in vial transport medium  ?Result Value Ref Range Status  ? SARS Coronavirus 2 by RT PCR NEGATIVE NEGATIVE Final  ?  Comment: (NOTE) ?SARS-CoV-2 target nucleic acids are NOT DETECTED. ? ?The SARS-CoV-2 RNA is generally detectable in upper respiratory ?specimens during the acute phase of infection. The lowest ?concentration of SARS-CoV-2 viral copies this assay can detect is ?138 copies/mL. A negative result does not preclude SARS-Cov-2 ?infection and should not be used as the sole basis for treatment or ?other patient management decisions. A negative result may occur with  ?improper specimen collection/handling, submission of specimen other ?than nasopharyngeal swab, presence of viral mutation(s) within the ?areas targeted by this assay, and inadequate number of viral ?copies(<138 copies/mL). A negative result must be combined with ?clinical observations, patient history, and epidemiological ?information. The expected result is Negative. ? ?Fact Sheet for Patients:  ?EntrepreneurPulse.com.au ? ?Fact Sheet for Healthcare Providers:  ?IncredibleEmployment.be ? ?This test is no t yet approved or cleared by the [place] FDA and  ?has been authorized for detection and/or diagnosis of SARS-CoV-2 by ?FDA under an Emergency Use Authorization (EUA). This EUA will remain  ?in effect (meaning this test can be used) for the duration of the ?COVID-19 declaration under Section 564(b)(1) of the Act, 21 ?U.S.C.section 360bbb-3(b)(1), unless the authorization is terminated  ?or revoked sooner.  ? ? ?  ? Influenza A by PCR NEGATIVE NEGATIVE Final  ? Influenza B by PCR NEGATIVE NEGATIVE Final  ?  Comment: (NOTE) ?The Xpert Xpress SARS-CoV-2/FLU/RSV plus assay is  intended as an aid ?in the diagnosis of influenza from Nasopharyngeal swab specimens and ?should not be used as a sole basis for treatment. Nasal washings and ?aspirates are unacceptable for Xpert Xpress SARS-CoV-2/FLU/RSV ?testing. ? ?Fact Sheet for Patients: ?EntrepreneurPulse.com.au ? ?Fact Sheet for Healthcare Providers: ?IncredibleEmployment.be ? ?This test is not yet approved or cleared by the [place] FDA and ?has been authorized for detection and/or diagnosis of SARS-CoV-2 by ?FDA under an Emergency Use Authorization (EUA). This EUA will remain ?in effect (meaning this test can be used) for the duration of the ?COVID-19 declaration under Section 564(b)(1) of the Act, 21 U.S.C. ?section 360bbb-3(b)(1), unless the authorization is terminated or ?revoked. ? ?Performed at [facility], [place], ?[place] [address] ?  ?  ? ? ? ? ? ?Radiology Studies: ?DG Chest Port 1 View ? ?Result Date: 10/26/2021 ?CLINICAL DATA:  Chest pain EXAM: PORTABLE CHEST 1 VIEW COMPARISON:  04/21/2016 FINDINGS: No focal opacity, pleural effusion, or pneumothorax. Normal cardiomediastinal silhouette. Mild central  vascular congestion. Borderline cardiac size. Retrocardiac oval opacity potentially due to hiatal hernia. IMPRESSION: 1. Borderline cardiomegaly with slight central congestion 2. Ovoid retrocardiac opacity, suspect hiatal hernia, though could correlate with lateral view of the chest. Electronically Signed   By: Donavan Foil M.D.   On: 10/26/2021 17:47   ? ? ? ? ? ?Scheduled Meds: ? amLODipine  5 mg Oral Daily  ? aspirin EC  81 mg Oral Daily  ? atorvastatin  80 mg Oral Daily  ? ?Continuous Infusions: ? heparin 1,000 Units/hr (10/28/21 0618)  ? ? ? LOS: 0 days  ? ? ?Time spent: 25 mins  ? ? ? ?Wyvonnia Dusky, MD ?[facility] ?Pager [phone] ? ?If 7PM-7AM, please contact night-coverage ?10/28/2021, 8:03 AM  ? ?

## 2021-10-28 NOTE — Progress Notes (Signed)
*  PRELIMINARY RESULTS* ?Echocardiogram ?2D Echocardiogram has been performed. ? ?Claretta Fraise ?10/28/2021, 12:02 PM ?

## 2021-10-28 NOTE — Consult Note (Signed)
ANTICOAGULATION CONSULT NOTE  ? ?Pharmacy Consult for Heparin  ?Indication: chest pain/ACS ? ?Allergies  ?Allergen Reactions  ? Sulfa Antibiotics Other (See Comments)  ?  Unknown reaction  ? ? ?Patient Measurements: ?Height: '5\' 5"'$  (165.1 cm) ?Weight: 70 kg (154 lb 5.2 oz) ?IBW/kg (Calculated) : 61.5 ?Heparin Dosing Weight: 70 kg ? ?Vital Signs: ?Temp: 98.1 ?F (36.7 ?C) (03/12 1120) ?BP: 136/74 (03/12 1120) ?Pulse Rate: 50 (03/12 1120) ? ?Labs: ?Recent Labs  ?  10/26/21 ?1707 10/26/21 ?1707 10/26/21 ?1840 10/27/21 ?6333 10/27/21 ?0902 10/27/21 ?1245 10/28/21 ?5456 10/28/21 ?1344  ?HGB 10.5*  --   --  9.4*  --   --  10.4*  --   ?HCT 33.7*  --   --  28.7*  --   --  31.7*  --   ?PLT 231  --   --  193  --   --  210  --   ?APTT  --   --  27  --   --   --   --   --   ?HEPARINUNFRC  --    < >  --  0.40  --  0.36 0.29* 0.52  ?CREATININE 1.55*  --   --  1.49*  --   --  1.64*  --   ?TROPONINIHS 25*  --  58*  --  32*  --   --   --   ? < > = values in this interval not displayed.  ? ? ? ?Estimated Creatinine Clearance: 31.3 mL/min (A) (by C-G formula based on SCr of 1.64 mg/dL (H)). ? ? ?Medical History: ?Past Medical History:  ?Diagnosis Date  ? Arthritis   ? Cataracts, both eyes   ? 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 History of kidney stones   ? Hypertension   ? ? ?Medications:  ?Medications Prior to Admission  ?Medication Sig Dispense Refill Last Dose  ? lisinopril-hydrochlorothiazide (ZESTORETIC) 20-12.5 MG tablet Take 1 tablet by mouth daily.   10/25/2021 at Unknown  ? tamsulosin (FLOMAX) 0.4 MG CAPS capsule Take 0.4 mg by mouth daily.   10/25/2021  ? No anticoagulants documented in medical history from PCP or from pharmacy refill records. ? ?Assessment: ?81yrold male reported to PCP for evaluation of severe chest pain as result of heavy exertion.  Also reported dizziness.  Has noted some SOB on exertion but never had this type pain. ? ?Goal of Therapy:  ?Heparin level 0.3-0.7 units/ml ?Monitor platelets by anticoagulation protocol:  Yes ? ?3/11 0514 HL 0.40, therapeutic x 1 ?3/11 1245 HL 0.36, therapeutic x 2 ?3/12 0521 HL 0.29, subtherapeutic ?3/12 1344 HL 0.52  ?  ?Plan:  ?Heparin level is therapeutic. Will continue heparin infusion at 1000 units/hr. Recheck heparin level in 8 hours. CBC daily while on heparin.   ? ?KEleonore Chiquito PharmD, BCPS ?10/28/2021 ?2:45 PM ? ? ? ? ? ?

## 2021-10-28 NOTE — Consult Note (Signed)
[facility] Cardiology ? ?CARDIOLOGY CONSULT NOTE  ?Patient ID: ?Juan [name] ?MRN: [id number] ?DOB/AGE: [birth year] 81 y.o. ? ?Admit date: 10/26/2021 ?Referring Physician Amy Cox ?Primary Physician Juluis Pitch, MD ?Primary Cardiologist None ?Reason for Consultation Elevated troponin ? ?HPI:  ?Juan [name] is an 81 year old male with a history of hypertension, CKD, hyperlipidemia, B12 deficiency who presents with chest pain and was discovered to have a mildly elevated troponin. ? ?Interval history: ?- No acute events.  ?- His son is here who provides some collateral history, and states he had one episode of chest pain ever that prompted his presentation to ED.  ?- NO further episodes of chest pain ? ? ?Social ?- Never smoker ? ?Review of systems complete and found to be negative unless listed above  ? ? ? ?Past Medical History:  ?Diagnosis Date  ? Arthritis   ? Cataracts, both eyes   ? 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 History of kidney stones   ? Hypertension   ?  ?Past Surgical History:  ?Procedure Laterality Date  ? APPENDECTOMY    ? COLONOSCOPY WITH PROPOFOL N/A 09/29/2018  ? Procedure: COLONOSCOPY WITH PROPOFOL;  Surgeon: Lollie Sails, MD;  Location: [facility] ENDOSCOPY;  Service: Endoscopy;  Laterality: N/A;  ? COLONOSCOPY WITH PROPOFOL N/A 04/15/2019  ? Procedure: COLONOSCOPY WITH PROPOFOL;  Surgeon: Lollie Sails, MD;  Location: [facility] ENDOSCOPY;  Service: Endoscopy;  Laterality: N/A;  ? WART FULGURATION N/A 06/10/2019  ? Procedure: FULGURATION ANAL WART;  Surgeon: Benjamine Sprague, DO;  Location: [facility] ORS;  Service: General;  Laterality: N/A;  ?  ?Medications Prior to Admission  ?Medication Sig Dispense Refill Last Dose  ? lisinopril-hydrochlorothiazide (ZESTORETIC) 20-12.5 MG tablet Take 1 tablet by mouth daily.   10/25/2021 at Unknown  ? tamsulosin (FLOMAX) 0.4 MG CAPS capsule Take 0.4 mg by mouth daily.   10/25/2021  ? ?Social History  ? ?Socioeconomic History  ? Marital status: Divorced  ?  Spouse name: Not on file   ? Number of children: Not on file  ? Years of education: Not on file  ? Highest education level: Not on file  ?Occupational History  ? Not on file  ?Tobacco Use  ? Smoking status: Never  ? Smokeless tobacco: Never  ?Vaping Use  ? Vaping Use: Never used  ?Substance and Sexual Activity  ? Alcohol use: No  ? Drug use: No  ? Sexual activity: Not on file  ?Other Topics Concern  ? Not on file  ?Social History Narrative  ? Not on file  ? ?Social Determinants of Health  ? ?Financial Resource Strain: Not on file  ?Food Insecurity: Not on file  ?Transportation Needs: Not on file  ?Physical Activity: Not on file  ?Stress: Not on file  ?Social Connections: Not on file  ?Intimate Partner Violence: Not on file  ?  ?Family History  ?Problem Relation Age of Onset  ? Hypertension Father   ?  ? ? ?Review of systems complete and found to be negative unless listed above  ? ? ? ? ?PHYSICAL EXAM ? ?General: Well developed, well nourished, in no acute distress ?HEENT:  Normocephalic and atramatic ?Neck:  No JVD.  ?Lungs: Clear bilaterally to auscultation and percussion. ?Heart: HRRR . Normal S1 and S2 without gallops or murmurs.  ?Abdomen: Bowel sounds are positive, abdomen soft and non-tender  ?Msk:  Back normal, normal gait. Normal strength and tone for age. ?Extremities: No clubbing, cyanosis or edema.   ?Neuro: Alert and  oriented X 3. ?Psych:  Good affect, responds appropriately ? ?Labs: ?  ?Lab Results  ?Component Value Date  ? WBC 5.8 10/28/2021  ? HGB 10.4 (L) 10/28/2021  ? HCT 31.7 (L) 10/28/2021  ? MCV 87.1 10/28/2021  ? PLT 210 10/28/2021  ?  ?Recent Labs  ?Lab 10/28/21 ?0160  ?NA 140  ?K 4.2  ?CL 109  ?CO2 25  ?BUN 29*  ?CREATININE 1.64*  ?CALCIUM 9.1  ?GLUCOSE 101*  ? ? ?Lab Results  ?Component Value Date  ? TROPONINI <0.03 04/22/2016  ? ?  ?Lab Results  ?Component Value Date  ? CHOL 208 (H) 10/27/2021  ? ?Lab Results  ?Component Value Date  ? HDL 71 10/27/2021  ? ?Lab Results  ?Component Value Date  ? LDLCALC 124 (H)  10/27/2021  ? ?Lab Results  ?Component Value Date  ? TRIG 63 10/27/2021  ? ?Lab Results  ?Component Value Date  ? CHOLHDL 2.9 10/27/2021  ? ?No results found for: LDLDIRECT  ?  ?Radiology: [facility] Chest Port 1 View ? ?Result Date: 10/26/2021 ?CLINICAL DATA:  Chest pain EXAM: PORTABLE CHEST 1 VIEW COMPARISON:  04/21/2016 FINDINGS: No focal opacity, pleural effusion, or pneumothorax. Normal cardiomediastinal silhouette. Mild central vascular congestion. Borderline cardiac size. Retrocardiac oval opacity potentially due to hiatal hernia. IMPRESSION: 1. Borderline cardiomegaly with slight central congestion 2. Ovoid retrocardiac opacity, suspect hiatal hernia, though could correlate with lateral view of the chest. Electronically Signed   By: Donavan Foil M.D.   On: 10/26/2021 17:47   ? ?EKG: Sinus bradycardia without acute ischemic changes. ? ?ASSESSMENT AND PLAN:  ?Juan [name] is an 81 year old male with a history of hypertension, CKD, hyperlipidemia, B12 deficiency who presents with chest pain and was discovered to have a mildly elevated troponin consistent with NSTEMI.  He was additionally found to be anemic with a hemoglobin of 9.4 down from 13.4 previously. ? ?# Chest Pain, elevated troponin ?#Anemia ?Presents with what sounds to be fairly typical angina with chest pain while working on his car that resolved after 15 minutes.  His EKG is nonischemic, but his troponin is mildly elevated to 50.  I am concerned about his new anemia with hemoglobin of 9.4, with iron studies suggestive of iron deficiency. This in combination with his renal insufficiency make him less of an ideal candidate for heart catheterization and potential PCI; will risk stratify with stress test.  ?- s/p aspirin 324 mg. Aspirin 81 mg indefinitely ?- Complete 48 hours of heparin this evening.  ?- Continue atorvastatin 80 mg ?- start amlodipine for antianginal effect and BP ?- No beta blocker d/t bradycardia ?- Please further evaluate his anemia ?-  Echocardiogram complete ordered ?- NM stress test tomorrow.  ? ?# Iron deficiency anemia ?Hgb decreased from 13.4 to ~ 10 this admission. Labs c/w iron deficiency. No overt signs of bleeding.  ? ?#Chronic kidney disease ?Creatinine is currently approximately 1.5 which is his baseline, but recently his creatinine has been elevated to above 2. ? ?# Hypertension ?BP is mildly elevated. Primary team is holding home lisinopril- HCTZ 20-12.5 ?- Continue amlodipine 5 mg (started 10/27/21) ? ?Signed: ?Andrez Grime MD ?10/28/2021, 8:47 AM ? ?

## 2021-10-29 ENCOUNTER — Inpatient Hospital Stay: Payer: Medicare Other

## 2021-10-29 DIAGNOSIS — I214 Non-ST elevation (NSTEMI) myocardial infarction: Secondary | ICD-10-CM

## 2021-10-29 LAB — BASIC METABOLIC PANEL
Anion gap: 6 (ref 5–15)
BUN: 33 mg/dL — ABNORMAL HIGH (ref 8–23)
CO2: 26 mmol/L (ref 22–32)
Calcium: 9.2 mg/dL (ref 8.9–10.3)
Chloride: 107 mmol/L (ref 98–111)
Creatinine, Ser: 1.66 mg/dL — ABNORMAL HIGH (ref 0.61–1.24)
GFR, Estimated: 41 mL/min — ABNORMAL LOW (ref >60)
Glucose, Bld: 101 mg/dL — ABNORMAL HIGH (ref 70–99)
Potassium: 4.1 mmol/L (ref 3.5–5.1)
Sodium: 139 mmol/L (ref 135–145)

## 2021-10-29 LAB — CBC
HCT: 32 % — ABNORMAL LOW (ref 39.0–52.0)
Hemoglobin: 10.2 g/dL — ABNORMAL LOW (ref 13.0–17.0)
MCH: 27.3 pg (ref 26.0–34.0)
MCHC: 31.9 g/dL (ref 30.0–36.0)
MCV: 85.8 fL (ref 80.0–100.0)
Platelets: 205 10*3/uL (ref 150–400)
RBC: 3.73 MIL/uL — ABNORMAL LOW (ref 4.22–5.81)
RDW: 13.6 % (ref 11.5–15.5)
WBC: 5.9 10*3/uL (ref 4.0–10.5)
nRBC: 0 % (ref 0.0–0.2)

## 2021-10-29 MED ORDER — SODIUM CHLORIDE 0.9% FLUSH: 3.0000 mL | INTRAVENOUS | Status: DC | PRN

## 2021-10-29 MED ORDER — SODIUM CHLORIDE 0.9 % WEIGHT BASED INFUSION
3.0000 mL/kg/h | INTRAVENOUS | Status: AC
  Administered 2021-10-30: 3 mL/kg/h via INTRAVENOUS

## 2021-10-29 MED ORDER — TECHNETIUM TC 99M TETROFOSMIN IV KIT
30.0000 | PACK | Freq: Once | INTRAVENOUS | Status: AC | PRN
  Administered 2021-10-29: 32.1 via INTRAVENOUS

## 2021-10-29 MED ORDER — SODIUM CHLORIDE 0.9 % IV SOLN: 250.0000 mL | INTRAVENOUS | Status: DC | PRN

## 2021-10-29 MED ORDER — SODIUM CHLORIDE 0.9% FLUSH
3.0000 mL | Freq: Two times a day (BID) | INTRAVENOUS | Status: DC
  Administered 2021-10-29 – 2021-10-30 (×4): 3 mL via INTRAVENOUS

## 2021-10-29 MED ORDER — REGADENOSON 0.4 MG/5ML IV SOLN
0.4000 mg | Freq: Once | INTRAVENOUS | Status: AC
  Administered 2021-10-29: 0.4 mg via INTRAVENOUS
  Filled 2021-10-29: qty 5

## 2021-10-29 MED ORDER — TECHNETIUM TC 99M TETROFOSMIN IV KIT
10.0000 | PACK | Freq: Once | INTRAVENOUS | Status: AC | PRN
  Administered 2021-10-29: 9.8 via INTRAVENOUS

## 2021-10-29 MED ORDER — SODIUM CHLORIDE 0.9 % WEIGHT BASED INFUSION
1.0000 mL/kg/h | INTRAVENOUS | Status: DC
  Administered 2021-10-30: 1 mL/kg/h via INTRAVENOUS

## 2021-10-29 MED ORDER — ASPIRIN 81 MG PO CHEW
81.0000 mg | CHEWABLE_TABLET | ORAL | Status: AC
  Administered 2021-10-30: 81 mg via ORAL
  Filled 2021-10-29: qty 1

## 2021-10-29 NOTE — Progress Notes (Signed)
RN in with consent for heart cath for am. Patient's daughter at bedside. Reviewed consent with patient and daughter Alexander Mt. Pt's daughter, Larene Beach apprehensive about patient signing the consent and has asked for Dr. Nehemiah Massed to contact her in the morning to discuss the procedure before the patient signs the consent. Daughter Larene Beach can be reached at [phone].  ?

## 2021-10-29 NOTE — Progress Notes (Signed)
PROGRESS NOTE    Juan [name]  MOL:[id number] DOB: [birth year] DOA: 10/26/2021 PCP: Juluis Pitch, MD    Assessment & Plan:   Principal Problem:   Chest pain Active Problems:   B12 deficiency   Essential hypertension   Hyperlipidemia   Elevated troponin   Stage 3a chronic kidney disease (CKD) ([place])   NSTEMI: as per cardio. Troponins are minimally elevated. S/p cardiac stress test which shows anterior myocardial perfusion abnormality and myocardial ischemia. Will go cardiac cath tomorrow as per cardio   HTN: continue to hold home dose of lisinopril & HCTZ  Likely CKDIIIa: Cr continues to trend up. Avoid nephrotoxic meds   HLD: started on and will continue on statin as per cardio    ACD: H&H are labile. Likely secondary to CKD. Started on iron supplements. Pt denied any hematemesis, hemoptysis, hematuria, bloody or black stools to me but admitted to having bloody stools a few weeks ago to cardio evidently. H&H are trending up today    DVT prophylaxis: SCDs Code Status: full Family Communication:  Disposition Plan: likely d/c back home   Level of care: Progressive  Status is: Inpatient Remains inpatient appropriate because: going for cardiac cath tomorrow     Consultants:  Cardio   Procedures:  Antimicrobials:    Subjective: Pt c/o fatigue   Objective: Vitals:   10/28/21 1613 10/28/21 1933 10/28/21 2314 10/29/21 0446  BP: 134/71 114/74 121/65 124/68  Pulse: 75 60 62 63  Resp: '16 17 17 14  '$ Temp: 98.3 F (36.8 C) 98.2 F (36.8 C) 98.4 F (36.9 C) 98.8 F (37.1 C)  TempSrc:      SpO2: 97% 99% 97% 99%  Weight:      Height:        Intake/Output Summary (Last 24 hours) at 10/29/2021 0759 Last data filed at 10/29/2021 2010 Gross per 24 hour  Intake 811.43 ml  Output --  Net 811.43 ml   Filed Weights   10/26/21 1851  Weight: 70 kg    Examination:  General exam: Appears calm & comfortable. Hard of hearing   Respiratory system: clear breath  sounds b/l. No wheezes, rales Cardiovascular system: S1 & S2+. No rubs or gallops  Gastrointestinal system: Abd is soft, NT, ND & hypoactive bowel sounds  Central nervous system: Alert and awake. Moves all extremities  Psychiatry: Judgement and insight appears at baseline. Appropriate mood and affect       Data Reviewed: I have personally reviewed following labs and imaging studies  CBC: Recent Labs  Lab 10/26/21 1707 10/27/21 0514 10/28/21 0521 10/29/21 0508  WBC 6.2 5.7 5.8 5.9  HGB 10.5* 9.4* 10.4* 10.2*  HCT 33.7* 28.7* 31.7* 32.0*  MCV 90.6 87.0 87.1 85.8  PLT 231 193 210 071   Basic Metabolic Panel: Recent Labs  Lab 10/26/21 1707 10/27/21 0514 10/28/21 0521 10/29/21 0508  NA 142 139 140 139  K 4.0 3.8 4.2 4.1  CL 108 107 109 107  CO2 '27 27 25 26  '$ GLUCOSE 119* 90 101* 101*  BUN 26* 25* 29* 33*  CREATININE 1.55* 1.49* 1.64* 1.66*  CALCIUM 9.2 8.6* 9.1 9.2   GFR: Estimated Creatinine Clearance: 30.9 mL/min (A) (by C-G formula based on SCr of 1.66 mg/dL (H)). Liver Function Tests: No results for input(s): AST, ALT, ALKPHOS, BILITOT, PROT, ALBUMIN in the last 168 hours. No results for input(s): LIPASE, AMYLASE in the last 168 hours. No results for input(s): AMMONIA in the last 168 hours. Coagulation Profile: No  results for input(s): INR, PROTIME in the last 168 hours. Cardiac Enzymes: No results for input(s): CKTOTAL, CKMB, CKMBINDEX, TROPONINI in the last 168 hours. BNP (last 3 results) No results for input(s): PROBNP in the last 8760 hours. HbA1C: No results for input(s): HGBA1C in the last 72 hours. CBG: No results for input(s): GLUCAP in the last 168 hours. Lipid Profile: Recent Labs    10/27/21 0514  CHOL 208*  HDL 71  LDLCALC 124*  TRIG 63  CHOLHDL 2.9   Thyroid Function Tests: No results for input(s): TSH, T4TOTAL, FREET4, T3FREE, THYROIDAB in the last 72 hours. Anemia Panel: Recent Labs    10/28/21 0521  FERRITIN 9*  TIBC 462*  IRON  32*   Sepsis Labs: No results for input(s): PROCALCITON, LATICACIDVEN in the last 168 hours.  Recent Results (from the past 240 hour(s))  Resp Panel by RT-PCR (Flu A&B, Covid) Nasopharyngeal Swab     Status: None   Collection Time: 10/26/21  6:40 PM   Specimen: Nasopharyngeal Swab; Nasopharyngeal(NP) swabs in vial transport medium  Result Value Ref Range Status   SARS Coronavirus 2 by RT PCR NEGATIVE NEGATIVE Final    Comment: (NOTE) SARS-CoV-2 target nucleic acids are NOT DETECTED.  The SARS-CoV-2 RNA is generally detectable in upper respiratory specimens during the acute phase of infection. The lowest concentration of SARS-CoV-2 viral copies this assay can detect is 138 copies/mL. A negative result does not preclude SARS-Cov-2 infection and should not be used as the sole basis for treatment or other patient management decisions. A negative result may occur with  improper specimen collection/handling, submission of specimen other than nasopharyngeal swab, presence of viral mutation(s) within the areas targeted by this assay, and inadequate number of viral copies(<138 copies/mL). A negative result must be combined with clinical observations, patient history, and epidemiological information. The expected result is Negative.  Fact Sheet for Patients:  EntrepreneurPulse.com.au  Fact Sheet for Healthcare Providers:  IncredibleEmployment.be  This test is no t yet approved or cleared by the [place] FDA and  has been authorized for detection and/or diagnosis of SARS-CoV-2 by FDA under an Emergency Use Authorization (EUA). This EUA will remain  in effect (meaning this test can be used) for the duration of the COVID-19 declaration under Section 564(b)(1) of the Act, 21 U.S.C.section 360bbb-3(b)(1), unless the authorization is terminated  or revoked sooner.       Influenza A by PCR NEGATIVE NEGATIVE Final   Influenza B by PCR NEGATIVE  NEGATIVE Final    Comment: (NOTE) The Xpert Xpress SARS-CoV-2/FLU/RSV plus assay is intended as an aid in the diagnosis of influenza from Nasopharyngeal swab specimens and should not be used as a sole basis for treatment. Nasal washings and aspirates are unacceptable for Xpert Xpress SARS-CoV-2/FLU/RSV testing.  Fact Sheet for Patients: EntrepreneurPulse.com.au  Fact Sheet for Healthcare Providers: IncredibleEmployment.be  This test is not yet approved or cleared by the [place] FDA and has been authorized for detection and/or diagnosis of SARS-CoV-2 by FDA under an Emergency Use Authorization (EUA). This EUA will remain in effect (meaning this test can be used) for the duration of the COVID-19 declaration under Section 564(b)(1) of the Act, 21 U.S.C. section 360bbb-3(b)(1), unless the authorization is terminated or revoked.  Performed at [facility], [address] [place], [place] [address]          Radiology Studies: ECHOCARDIOGRAM COMPLETE  Result Date: 10/28/2021    ECHOCARDIOGRAM REPORT   Patient Name:   Juan [name]  Cuartas Date of Exam: 10/28/2021 Medical Rec #:  [id number]       Height:       65.0 in Accession #:    [id number]      Weight:       154.3 lb Date of Birth:  11/21/1940       BSA:          1.772 m Patient Age:    81 years        BP:           129/69 mmHg Patient Gender: M               HR:           49 bpm. Exam Location:  [facility] Procedure: 2D Echo Indications:     Chest Pain R07.9                  Elevated Troponin  History:         Patient has no prior history of Echocardiogram examinations.  Sonographer:     Kathlen Brunswick RDCS Referring Phys:  [id number] AMY N COX Diagnosing Phys: Donnelly Angelica IMPRESSIONS  1. Left ventricular ejection fraction, by estimation, is 60 to 65%. The left ventricle has normal function. The left ventricle has no regional wall motion abnormalities. Left ventricular diastolic parameters were  normal.  2. Right ventricular systolic function is normal. The right ventricular size is normal.  3. The mitral valve is normal in structure. Mild mitral valve regurgitation. No evidence of mitral stenosis.  4. The aortic valve is normal in structure. Aortic valve regurgitation is trivial. No aortic stenosis is present.  5. The inferior vena cava is normal in size with greater than 50% respiratory variability, suggesting right atrial pressure of 3 mmHg. FINDINGS  Left Ventricle: Left ventricular ejection fraction, by estimation, is 60 to 65%. The left ventricle has normal function. The left ventricle has no regional wall motion abnormalities. The left ventricular internal cavity size was normal in size. There is  no left ventricular hypertrophy. Left ventricular diastolic parameters were normal. Right Ventricle: The right ventricular size is normal. No increase in right ventricular wall thickness. Right ventricular systolic function is normal. Left Atrium: Left atrial size was normal in size. Right Atrium: Right atrial size was normal in size. Pericardium: There is no evidence of pericardial effusion. Mitral Valve: The mitral valve is normal in structure. Mild mitral valve regurgitation. No evidence of mitral valve stenosis. Tricuspid Valve: The tricuspid valve is normal in structure. Tricuspid valve regurgitation is trivial. No evidence of tricuspid stenosis. Aortic Valve: The aortic valve is normal in structure. Aortic valve regurgitation is trivial. No aortic stenosis is present. Aortic valve peak gradient measures 7.5 mmHg. Pulmonic Valve: The pulmonic valve was not well visualized. Pulmonic valve regurgitation is mild. No evidence of pulmonic stenosis. Aorta: The aortic root is normal in size and structure. Venous: The inferior vena cava is normal in size with greater than 50% respiratory variability, suggesting right atrial pressure of 3 mmHg. IAS/Shunts: No atrial level shunt detected by color flow Doppler.   LEFT VENTRICLE PLAX 2D LVIDd:         4.74 cm Diastology LVIDs:         3.10 cm LV e' medial:    8.27 cm/s LV PW:         0.96 cm LV E/e' medial:  10.4 LV IVS:        0.93 cm LV e' lateral:  9.57 cm/s                        LV E/e' lateral: 9.0  RIGHT VENTRICLE RV Basal diam:  3.00 cm RV S prime:     14.40 cm/s TAPSE (M-mode): 1.9 cm LEFT ATRIUM           Index        RIGHT ATRIUM           Index LA diam:      3.10 cm 1.75 cm/m   RA Area:     13.90 cm LA Vol (A2C): 53.8 ml 30.37 ml/m  RA Volume:   34.50 ml  19.47 ml/m LA Vol (A4C): 56.3 ml 31.78 ml/m  AORTIC VALVE              PULMONIC VALVE AV Vmax:      137.09 cm/s PV Vmax:          1.23 m/s AV Peak Grad: 7.5 mmHg    PV Peak grad:     6.1 mmHg LVOT Vmax:    110.00 cm/s PR End Diast Vel: 2.31 msec LVOT Vmean:   76.600 cm/s LVOT VTI:     0.252 m  AORTA Ao Root diam: 2.80 cm Ao Asc diam:  2.90 cm MITRAL VALVE               TRICUSPID VALVE MV Area (PHT): 3.65 cm    TV Peak grad:   24.0 mmHg MV Decel Time: 208 msec    TV Vmax:        2.45 m/s MV E velocity: 85.70 cm/s MV A velocity: 56.10 cm/s  SHUNTS MV E/A ratio:  1.53        Systemic VTI: 0.25 m Donnelly Angelica Electronically signed by Donnelly Angelica Signature Date/Time: 10/28/2021/12:40:59 PM    Final         Scheduled Meds:  amLODipine  5 mg Oral Daily   aspirin EC  81 mg Oral Daily   atorvastatin  80 mg Oral Daily   ferrous gluconate  324 mg Oral Q breakfast   Continuous Infusions:     LOS: 1 day    Time spent: 20 mins     Wyvonnia Dusky, MD [facility] Pager [phone]  If 7PM-7AM, please contact night-coverage 10/29/2021, 7:59 AM

## 2021-10-29 NOTE — Progress Notes (Addendum)
[facility] Cardiology  CARDIOLOGY CONSULT NOTE  Patient ID: Juan [name] MRN: [id number] DOB/AGE: 1941-03-27 81 y.o.  Admit date: 10/26/2021 Referring Physician Amy Cox Primary Physician Juluis Pitch, MD Primary Cardiologist None Reason for Consultation Elevated troponin  HPI:  Juan [name] is an 81 year old male with a history of hypertension, CKD, hyperlipidemia, B12 deficiency who presents with chest pain and was discovered to have a mildly elevated troponin. The patient has had some anginal symptoms over the last 3 to 4 weeks which are progressive in nature and had full relief with medication management on admission.  Stress test showed significant symptoms with an abnormal moderate reversible anterior myocardial perfusion defect consistent with myocardial ischemia Further discussion with the patient for concerns of progression of angina with abnormal stress test and coronary artery disease in the left anterior descending artery distribution.  He and his friend have had a long discussion about cardiac catheterization and its risks and benefits.  He understands the risk and benefits and wishes to proceed to further answer questions listed above Interval history: - No acute events. No further chest pain. Inquires if he needs to have a blood transfusion.  - NPO for stress test today.   Review of systems complete and found to be negative unless listed above     Past Medical History:  Diagnosis Date   Arthritis    Cataracts, both eyes    Hearing loss    left ear   History of kidney stones    Hypertension     Past Surgical History:  Procedure Laterality Date   APPENDECTOMY     COLONOSCOPY WITH PROPOFOL N/A 09/29/2018   Procedure: COLONOSCOPY WITH PROPOFOL;  Surgeon: Lollie Sails, MD;  Location: [facility] ENDOSCOPY;  Service: Endoscopy;  Laterality: N/A;   COLONOSCOPY WITH PROPOFOL N/A 04/15/2019   Procedure: COLONOSCOPY WITH PROPOFOL;  Surgeon: Lollie Sails, MD;   Location: [facility] ENDOSCOPY;  Service: Endoscopy;  Laterality: N/A;   WART FULGURATION N/A 06/10/2019   Procedure: FULGURATION ANAL WART;  Surgeon: Benjamine Sprague, DO;  Location: [facility] ORS;  Service: General;  Laterality: N/A;    Medications Prior to Admission  Medication Sig Dispense Refill Last Dose   lisinopril-hydrochlorothiazide (ZESTORETIC) 20-12.5 MG tablet Take 1 tablet by mouth daily.   10/25/2021 at Unknown   tamsulosin (FLOMAX) 0.4 MG CAPS capsule Take 0.4 mg by mouth daily.   10/25/2021   Social History   Socioeconomic History   Marital status: Divorced    Spouse name: Not on file   Number of children: Not on file   Years of education: Not on file   Highest education level: Not on file  Occupational History   Not on file  Tobacco Use   Smoking status: Never   Smokeless tobacco: Never  Vaping Use   Vaping Use: Never used  Substance and Sexual Activity   Alcohol use: No   Drug use: No   Sexual activity: Not on file  Other Topics Concern   Not on file  Social History Narrative   Not on file   Social Determinants of Health   Financial Resource Strain: Not on file  Food Insecurity: Not on file  Transportation Needs: Not on file  Physical Activity: Not on file  Stress: Not on file  Social Connections: Not on file  Intimate Partner Violence: Not on file    Family History  Problem Relation Age of Onset   Hypertension Father     PHYSICAL EXAM  General: Elderly Caucasian  male. Well developed, well nourished, in no acute distress. HEENT:  Normocephalic and atraumatic. Extremely hard of hearing.  Neck:  No JVD.  Lungs: Clear bilaterally to auscultation . Heart: HRRR . Normal S1 and S2 without gallops or murmurs.  Abdomen: non distended appearing. Msk:  Normal strength and tone for age. Extremities: No obvious clubbing, cyanosis or edema Neuro: Alert and oriented X 3. Psych:  Good affect, responds appropriately  Labs:   Lab Results  Component Value Date   WBC 5.9  10/29/2021   HGB 10.2 (L) 10/29/2021   HCT 32.0 (L) 10/29/2021   MCV 85.8 10/29/2021   PLT 205 10/29/2021    Recent Labs  Lab 10/29/21 0508  NA 139  K 4.1  CL 107  CO2 26  BUN 33*  CREATININE 1.66*  CALCIUM 9.2  GLUCOSE 101*    Lab Results  Component Value Date   TROPONINI <0.03 04/22/2016     Lab Results  Component Value Date   CHOL 208 (H) 10/27/2021   Lab Results  Component Value Date   HDL 71 10/27/2021   Lab Results  Component Value Date   LDLCALC 124 (H) 10/27/2021   Lab Results  Component Value Date   TRIG 63 10/27/2021   Lab Results  Component Value Date   CHOLHDL 2.9 10/27/2021   No results found for: LDLDIRECT    Radiology: [facility] Chest Port 1 View  Result Date: 10/26/2021 CLINICAL DATA:  Chest pain EXAM: PORTABLE CHEST 1 VIEW COMPARISON:  04/21/2016 FINDINGS: No focal opacity, pleural effusion, or pneumothorax. Normal cardiomediastinal silhouette. Mild central vascular congestion. Borderline cardiac size. Retrocardiac oval opacity potentially due to hiatal hernia. IMPRESSION: 1. Borderline cardiomegaly with slight central congestion 2. Ovoid retrocardiac opacity, suspect hiatal hernia, though could correlate with lateral view of the chest. Electronically Signed   By: Donavan Foil M.D.   On: 10/26/2021 17:47   ECHOCARDIOGRAM COMPLETE  Result Date: 10/28/2021    ECHOCARDIOGRAM REPORT   Patient Name:   Juan [name] Date of Exam: 10/28/2021 Medical Rec #:  [id number]       Height:       65.0 in Accession #:    [id number]      Weight:       154.3 lb Date of Birth:  December 08, 1940       BSA:          1.772 m Patient Age:    32 years        BP:           129/69 mmHg Patient Gender: M               HR:           49 bpm. Exam Location:  [facility] Procedure: 2D Echo Indications:     Chest Pain R07.9                  Elevated Troponin  History:         Patient has no prior history of Echocardiogram examinations.  Sonographer:     Kathlen Brunswick RDCS Referring Phys:  [id number]  AMY N COX Diagnosing Phys: Donnelly Angelica IMPRESSIONS  1. Left ventricular ejection fraction, by estimation, is 60 to 65%. The left ventricle has normal function. The left ventricle has no regional wall motion abnormalities. Left ventricular diastolic parameters were normal.  2. Right ventricular systolic function is normal. The right ventricular size is normal.  3. The mitral valve is normal in structure.  Mild mitral valve regurgitation. No evidence of mitral stenosis.  4. The aortic valve is normal in structure. Aortic valve regurgitation is trivial. No aortic stenosis is present.  5. The inferior vena cava is normal in size with greater than 50% respiratory variability, suggesting right atrial pressure of 3 mmHg. FINDINGS  Left Ventricle: Left ventricular ejection fraction, by estimation, is 60 to 65%. The left ventricle has normal function. The left ventricle has no regional wall motion abnormalities. The left ventricular internal cavity size was normal in size. There is  no left ventricular hypertrophy. Left ventricular diastolic parameters were normal. Right Ventricle: The right ventricular size is normal. No increase in right ventricular wall thickness. Right ventricular systolic function is normal. Left Atrium: Left atrial size was normal in size. Right Atrium: Right atrial size was normal in size. Pericardium: There is no evidence of pericardial effusion. Mitral Valve: The mitral valve is normal in structure. Mild mitral valve regurgitation. No evidence of mitral valve stenosis. Tricuspid Valve: The tricuspid valve is normal in structure. Tricuspid valve regurgitation is trivial. No evidence of tricuspid stenosis. Aortic Valve: The aortic valve is normal in structure. Aortic valve regurgitation is trivial. No aortic stenosis is present. Aortic valve peak gradient measures 7.5 mmHg. Pulmonic Valve: The pulmonic valve was not well visualized. Pulmonic valve regurgitation is mild. No evidence of pulmonic stenosis.  Aorta: The aortic root is normal in size and structure. Venous: The inferior vena cava is normal in size with greater than 50% respiratory variability, suggesting right atrial pressure of 3 mmHg. IAS/Shunts: No atrial level shunt detected by color flow Doppler.  LEFT VENTRICLE PLAX 2D LVIDd:         4.74 cm Diastology LVIDs:         3.10 cm LV e' medial:    8.27 cm/s LV PW:         0.96 cm LV E/e' medial:  10.4 LV IVS:        0.93 cm LV e' lateral:   9.57 cm/s                        LV E/e' lateral: 9.0  RIGHT VENTRICLE RV Basal diam:  3.00 cm RV S prime:     14.40 cm/s TAPSE (M-mode): 1.9 cm LEFT ATRIUM           Index        RIGHT ATRIUM           Index LA diam:      3.10 cm 1.75 cm/m   RA Area:     13.90 cm LA Vol (A2C): 53.8 ml 30.37 ml/m  RA Volume:   34.50 ml  19.47 ml/m LA Vol (A4C): 56.3 ml 31.78 ml/m  AORTIC VALVE              PULMONIC VALVE AV Vmax:      137.09 cm/s PV Vmax:          1.23 m/s AV Peak Grad: 7.5 mmHg    PV Peak grad:     6.1 mmHg LVOT Vmax:    110.00 cm/s PR End Diast Vel: 2.31 msec LVOT Vmean:   76.600 cm/s LVOT VTI:     0.252 m  AORTA Ao Root diam: 2.80 cm Ao Asc diam:  2.90 cm MITRAL VALVE               TRICUSPID VALVE MV Area (PHT): 3.65 cm    TV Peak grad:  24.0 mmHg MV Decel Time: 208 msec    TV Vmax:        2.45 m/s MV E velocity: 85.70 cm/s MV A velocity: 56.10 cm/s  SHUNTS MV E/A ratio:  1.53        Systemic VTI: 0.25 m Donnelly Angelica Electronically signed by Donnelly Angelica Signature Date/Time: 10/28/2021/12:40:59 PM    Final     EKG: Sinus bradycardia without acute ischemic changes.  ASSESSMENT AND PLAN:  Juan [name] is an 81 year old male with a history of hypertension, CKD, hyperlipidemia, B12 deficiency who presents with chest pain and was discovered to have a mildly elevated troponin consistent with NSTEMI.  He was additionally found to be anemic with a hemoglobin of 9.4 down from 13.4 previously.  # Chest Pain, elevated troponin Abnormal stress test consistent with  anterior myocardial perfusion abnormality and myocardial ischemia -Proceed to cardiac catheterization to assess coronary anatomy and further treatment as necessary.  Patient understands the risk and benefits of cardiac catheterization.  This includes a possibility of death stroke heart attack infection bleeding or blood clot.  He is at low risk for conscious sedation  # Anemia Presents with what sounds to be fairly typical angina with chest pain while working on his car that resolved after 15 minutes.  His EKG is nonischemic, but his troponin is mildly elevated to 50.  I am concerned about his new anemia with hemoglobin of 9.4, with iron studies suggestive of iron deficiency. This in combination with his renal insufficiency make him less of an ideal candidate for heart catheterization and potential PCI; will risk stratify with stress test.  - s/p aspirin 324 mg. Aspirin 81 mg indefinitely - Completed 48 hours of heparin 3/12 - Continue atorvastatin 80 mg - continue amlodipine for antianginal effect and BP - No beta blocker d/t bradycardia - Echocardiogram resulted with normal LVEF, mild MR, trivial AR - NM stress test performed today and result is pending.   # Iron deficiency anemia Hgb decreased from 13.4 to ~ 10 this admission. Labs c/w iron deficiency. No overt signs of bleeding.  - on PO iron per primary team  # Chronic kidney disease Creatinine is currently approximately 1.5 which is his baseline, but recently his creatinine has been elevated to above 2.  # Hypertension BP improved today. Primary team is holding home lisinopril- HCTZ 20-12.5 - Continue amlodipine 5 mg (started 10/27/21)  This patient's plan of care was discussed and created with Dr. Serafina Royals and he is in agreement.    Signed: Tristan Schroeder, PA-C 10/29/2021, 1:05 PM  The patient has been interviewed and examined. I agree with assessment and plan above. Serafina Royals MD [facility]

## 2021-10-30 ENCOUNTER — Encounter
Admission: EM | Disposition: A | Discharge status: Other Acute Inpatient Hospital | Payer: Self-pay | Source: Ambulatory Visit | Attending: Internal Medicine

## 2021-10-30 LAB — NM MYOCAR MULTI W/SPECT W/WALL MOTION / EF
Estimated workload: 1
Exercise duration (min): 1 min
Exercise duration (sec): 0 s
LV dias vol: 67 mL (ref 62–150)
LV sys vol: 28 mL
MPHR: 140 {beats}/min
Nuc Stress EF: 58 %
Peak HR: 86 {beats}/min
Percent HR: 61 %
Rest HR: 52 {beats}/min
Rest Nuclear Isotope Dose: 9.8 mCi
SDS: 4
SRS: 0
SSS: 0
ST Depression (mm): 0 mm
Stress Nuclear Isotope Dose: 32.1 mCi
TID: 1.05

## 2021-10-30 LAB — CBC
HCT: 38.3 % — ABNORMAL LOW (ref 39.0–52.0)
Hemoglobin: 12.4 g/dL — ABNORMAL LOW (ref 13.0–17.0)
MCH: 28.1 pg (ref 26.0–34.0)
MCHC: 32.4 g/dL (ref 30.0–36.0)
MCV: 86.7 fL (ref 80.0–100.0)
Platelets: 251 10*3/uL (ref 150–400)
RBC: 4.42 MIL/uL (ref 4.22–5.81)
RDW: 13.6 % (ref 11.5–15.5)
WBC: 7.1 10*3/uL (ref 4.0–10.5)
nRBC: 0 % (ref 0.0–0.2)

## 2021-10-30 LAB — BASIC METABOLIC PANEL
Anion gap: 9 (ref 5–15)
BUN: 33 mg/dL — ABNORMAL HIGH (ref 8–23)
CO2: 23 mmol/L (ref 22–32)
Calcium: 9.5 mg/dL (ref 8.9–10.3)
Chloride: 105 mmol/L (ref 98–111)
Creatinine, Ser: 1.66 mg/dL — ABNORMAL HIGH (ref 0.61–1.24)
GFR, Estimated: 41 mL/min — ABNORMAL LOW (ref >60)
Glucose, Bld: 122 mg/dL — ABNORMAL HIGH (ref 70–99)
Potassium: 4.2 mmol/L (ref 3.5–5.1)
Sodium: 137 mmol/L (ref 135–145)

## 2021-10-30 SURGERY — LEFT HEART CATH AND CORONARY ANGIOGRAPHY
Anesthesia: Moderate Sedation

## 2021-10-30 MED ORDER — MIDAZOLAM HCL 2 MG/2ML IJ SOLN
INTRAMUSCULAR | Status: AC
  Filled 2021-10-30: qty 2

## 2021-10-30 MED ORDER — IOHEXOL 300 MG/ML  SOLN
INTRAMUSCULAR | Status: DC | PRN
  Administered 2021-10-30: 78 mL

## 2021-10-30 MED ORDER — HYDRALAZINE HCL 20 MG/ML IJ SOLN: 10.0000 mg | INTRAMUSCULAR | Status: AC | PRN

## 2021-10-30 MED ORDER — LIDOCAINE HCL 1 % IJ SOLN
INTRAMUSCULAR | Status: AC
  Filled 2021-10-30: qty 20

## 2021-10-30 MED ORDER — FENTANYL CITRATE (PF) 100 MCG/2ML IJ SOLN
INTRAMUSCULAR | Status: AC
  Filled 2021-10-30: qty 2

## 2021-10-30 MED ORDER — HEPARIN SODIUM (PORCINE) 1000 UNIT/ML IJ SOLN
INTRAMUSCULAR | Status: AC
  Filled 2021-10-30: qty 10

## 2021-10-30 MED ORDER — LABETALOL HCL 5 MG/ML IV SOLN: 10.0000 mg | INTRAVENOUS | Status: AC | PRN

## 2021-10-30 MED ORDER — VERAPAMIL HCL 2.5 MG/ML IV SOLN
INTRAVENOUS | Status: DC | PRN
  Administered 2021-10-30: 2.5 mg via INTRA_ARTERIAL

## 2021-10-30 MED ORDER — HEPARIN (PORCINE) IN NACL 2000-0.9 UNIT/L-% IV SOLN
INTRAVENOUS | Status: DC | PRN
  Administered 2021-10-30: 1000 mL

## 2021-10-30 MED ORDER — HEPARIN (PORCINE) IN NACL 1000-0.9 UT/500ML-% IV SOLN
INTRAVENOUS | Status: AC
  Filled 2021-10-30: qty 1000

## 2021-10-30 MED ORDER — TAMSULOSIN HCL 0.4 MG PO CAPS
0.4000 mg | ORAL_CAPSULE | Freq: Every day | ORAL | Status: DC
  Administered 2021-10-31: 0.4 mg via ORAL
  Filled 2021-10-30: qty 1

## 2021-10-30 MED ORDER — HEPARIN SODIUM (PORCINE) 1000 UNIT/ML IJ SOLN
INTRAMUSCULAR | Status: DC | PRN
  Administered 2021-10-30: 3000 [IU] via INTRAVENOUS

## 2021-10-30 MED ORDER — SODIUM CHLORIDE 0.9 % WEIGHT BASED INFUSION
1.0000 mL/kg/h | INTRAVENOUS | Status: AC
  Administered 2021-10-30: 1 mL/kg/h via INTRAVENOUS

## 2021-10-30 MED ORDER — ACETAMINOPHEN 325 MG PO TABS: 650.0000 mg | ORAL_TABLET | ORAL | Status: DC | PRN

## 2021-10-30 MED ORDER — HEPARIN (PORCINE) 25000 UT/250ML-% IV SOLN
1000.0000 [IU]/h | INTRAVENOUS | Status: DC
  Administered 2021-10-30 – 2021-10-31 (×2): 1000 [IU]/h via INTRAVENOUS
  Filled 2021-10-30 (×2): qty 250

## 2021-10-30 MED ORDER — LIDOCAINE HCL (PF) 1 % IJ SOLN
INTRAMUSCULAR | Status: DC | PRN
  Administered 2021-10-30: 2 mL

## 2021-10-30 MED ORDER — ONDANSETRON HCL 4 MG/2ML IJ SOLN: 4.0000 mg | Freq: Four times a day (QID) | INTRAMUSCULAR | Status: DC | PRN

## 2021-10-30 MED ORDER — HEPARIN BOLUS VIA INFUSION
2300.0000 [IU] | Freq: Once | INTRAVENOUS | Status: AC
  Administered 2021-10-30: 2300 [IU] via INTRAVENOUS
  Filled 2021-10-30: qty 2300

## 2021-10-30 MED ORDER — VERAPAMIL HCL 2.5 MG/ML IV SOLN
INTRAVENOUS | Status: AC
  Filled 2021-10-30: qty 2

## 2021-10-30 SURGICAL SUPPLY — 11 items
CATH 5FR JL3.5 JR4 ANG PIG MP (CATHETERS) ×1 IMPLANT
DEVICE RAD TR BAND REGULAR (VASCULAR PRODUCTS) ×1 IMPLANT
DRAPE BRACHIAL (DRAPES) ×1 IMPLANT
GLIDESHEATH SLEND SS 6F .021 (SHEATH) ×1 IMPLANT
GUIDEWIRE INQWIRE 1.5J.035X260 (WIRE) IMPLANT
INQWIRE 1.5J .035X260CM (WIRE) ×2
PACK CARDIAC CATH (CUSTOM PROCEDURE TRAY) ×2 IMPLANT
PROTECTION STATION PRESSURIZED (MISCELLANEOUS) ×2
SET ATX SIMPLICITY (MISCELLANEOUS) ×1 IMPLANT
STATION PROTECTION PRESSURIZED (MISCELLANEOUS) IMPLANT
WIRE HITORQ VERSACORE ST 145CM (WIRE) ×1 IMPLANT

## 2021-10-30 NOTE — Consult Note (Signed)
ANTICOAGULATION CONSULT NOTE  ? ?Pharmacy Consult for Heparin  ?Indication: chest pain/ACS ? ?Allergies  ?Allergen Reactions  ? Sulfa Antibiotics Other (See Comments)  ?  Unknown reaction  ? ? ?Patient Measurements: ?Height: '5\' 5"'$  (165.1 cm) ?Weight: 63.3 kg (139 lb 9.6 oz) ?IBW/kg (Calculated) : 61.5 ?Heparin Dosing Weight: 70 kg ? ?Vital Signs: ?Temp: 97.6 ?F (36.4 ?C) (03/14 1216) ?Temp Source: Oral (03/14 1216) ?BP: 132/109 (03/14 1515) ?Pulse Rate: 59 (03/14 1515) ? ?Labs: ?Recent Labs  ?  10/28/21 ?5329 10/28/21 ?1344 10/29/21 ?9242 10/30/21 ?6834  ?HGB 10.4*  --  10.2* 12.4*  ?HCT 31.7*  --  32.0* 38.3*  ?PLT 210  --  205 251  ?HEPARINUNFRC 0.29* 0.52  --   --   ?CREATININE 1.64*  --  1.66* 1.66*  ? ? ? ?Estimated Creatinine Clearance: 30.9 mL/min (A) (by C-G formula based on SCr of 1.66 mg/dL (H)). ? ? ?Medical History: ?Past Medical History:  ?Diagnosis Date  ? Arthritis   ? Cataracts, both eyes   ? 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 History of kidney stones   ? Hypertension   ? ? ?Medications:  ?Medications Prior to Admission  ?Medication Sig Dispense Refill Last Dose  ? lisinopril-hydrochlorothiazide (ZESTORETIC) 20-12.5 MG tablet Take 1 tablet by mouth daily.   10/25/2021 at Unknown  ? tamsulosin (FLOMAX) 0.4 MG CAPS capsule Take 0.4 mg by mouth daily.   10/25/2021  ? No anticoagulants documented in medical history from PCP or from pharmacy refill records. ? ?Assessment: ?81yrold male with HTN, CKD3a, HLD presents to ED for evaluation of severe chest pain as result of heavy exertion. Cardiac catheterization showing critical bifurcating left anterior descending artery stenosis at large diagonal and multiple significant stenoses on a tortuous right coronary artery at up to 95%. Cardiology plans for CABG for unstable angina in the setting of elevated troponin and severe two-vessel coronary artery disease with tortuous right and left anterior descending artery stenoses. Pharmacy consulted for management of heparin in  the setting of unstable angina.  ? ? ?Goal of Therapy:  ?Heparin level 0.3-0.7 units/ml ?Monitor platelets by anticoagulation protocol: Yes ? ?3/11 0514 HL 0.40, therapeutic x 1 ?3/11 1245 HL 0.36, therapeutic x 2 ?3/12 0521 HL 0.29, subtherapeutic/ 850 > 1000 units/hr ?3/12 1344 HL 0.52, therapeutic  ?3/12 2112 Heparin drip stopped ?3/14 1320 Heparin 3000 units x1 ? ?Plan:  ?Heparin infusion stopped ~2 hours ago. Given half life of heparin (1.5 hours), likely 1500 units remaining in bloodstream. Will subtract this from new bolus at restart.  ?Give heparin IV bolus 2300 units x1 and restart heparin IV infusion at 1000 units/hr.  ?Recheck heparin level in 8 hours.  ?CBC daily while on heparin.   ? ?ADarrick Penna PharmD, MS PGPM ?Clinical Pharmacist ?10/30/2021 ?4:33 PM ? ? ? ? ? ? ?

## 2021-10-30 NOTE — Progress Notes (Signed)
Dr. Nehemiah Massed in at bedside, speaking with pt. Son and pt. Sister re: cath results. Both verbalize understanding of conversation.  ?

## 2021-10-30 NOTE — Progress Notes (Signed)
?PROGRESS NOTE ? ? ?HPI was taken from Dr. Tobie Poet: ?[name] is an 81 year old male with history of hypertension, hyperlipidemia, B12 deficiency, presents emergency department for chief concerns of chest pain. ? ?Vitals in the emergency department showed temperature of 90.5, respiration rate of 18, heart rate 56, blood pressure 143/81, SPO2 of 94% on room air. ? ?Serum sodium 142, potassium 4.0, chloride 108, bicarb 27, BUN of 26, serum creatinine of 1.55, GFR 45, nonfasting blood glucose 119, WBC 6.2, hemoglobin 10.5, platelets of 231.   ?  ?Troponin was 25. ?  ?ED treatment: Aspirin 324 mg p.o. one-time dose ?  ?At bedside, he is able to tell me his name, age, current location. He states he was working on his car, getting an air tank, he tried to unload from his car, when he developed chest pain across his chest, this lasted about 10-15 minutes and resolved with rest. He endorses feeling shortness of breath.  He states that he felt like he was going to pass out but he did not. ?  ?He denies nausea, vomiting, abdominal pain.  ?  ?Social history: He denies history of tobacco use, etoh. He is retired and formerly worked for [employer].  ?  ?Vaccination history: he is vaccinated for covid, 2 doses ? ? Juan [name]  SJG:[id number] DOB: 22-May-1941 DOA: 10/26/2021 ?PCP: Juluis Pitch, MD  ? ? ?Assessment & Plan: ?  ?Principal Problem: ?  Chest pain ?Active Problems: ?  B12 deficiency ?  Essential hypertension ?  Hyperlipidemia ?  Elevated troponin ?  Stage 3a chronic kidney disease (CKD) ([place]) ? ? ?NSTEMI: as per cardio. Troponins are minimally elevated. S/p cardiac stress test which shows anterior myocardial perfusion abnormality and myocardial ischemia. Cardiac cath today as per cardio ? ?HTN: continue to hold home dose of HCTZ, lisinopril  ? ?Likely CKDIIIa: Cr is stable from day prior. Avoid nephrotoxic meds  ? ?HLD: started on and will continue on statin as per cardio  ? ?ACD: H&H are labile. Likely  secondary to CKD. Started on iron supplements. Pt denied any hematemesis, hemoptysis, hematuria, bloody or black stools to me but admitted to having bloody stools a few weeks ago to cardio evidently. H&H continues to trend up.  ? ? ?DVT prophylaxis: SCDs ?Code Status: full ?Family Communication: discussed pt's care w/ pt's family at bedside and answered their questions  ?Disposition Plan: likely d/c back home  ? ?Level of care: Progressive ? ?Status is: Inpatient ?Remains inpatient appropriate because: cardiac cath today  ? ? ? ?Consultants:  ?Cardio  ? ?Procedures: ? ?Antimicrobials:  ? ? ?Subjective: ?Pt c/o ringing in the ear. Pt denies any chest pain  ? ?Objective: ?Vitals:  ? 10/29/21 1655 10/29/21 2008 10/29/21 2352 10/30/21 0328  ?BP: 131/64 124/63 130/80 117/76  ?Pulse: 60 60 64 63  ?Resp: '16 18 18 16  '$ ?Temp: 97.9 ?F (36.6 ?C) 97.7 ?F (36.5 ?C) 98.1 ?F (36.7 ?C) 97.6 ?F (36.4 ?C)  ?TempSrc: Oral     ?SpO2: 100% 98% 99% 99%  ?Weight:    63.3 kg  ?Height:      ? ? ?Intake/Output Summary (Last 24 hours) at 10/30/2021 0809 ?Last data filed at 10/30/2021 6546 ?Gross per 24 hour  ?Intake 388.5 ml  ?Output --  ?Net 388.5 ml  ? ?Filed Weights  ? 10/26/21 1851 10/30/21 0328  ?Weight: 70 kg 63.3 kg  ? ? ?Examination: ? ?General exam: Appears comfortable. Hard of hearing  ?Respiratory system: clear breath sounds  b/l ?Cardiovascular system: S1/S2+. No rubs or clicks  ?Gastrointestinal system: Abd is soft, NT, ND & normal bowel sounds  ?Central nervous system: alert and awake. Moves all extremities  ?Psychiatry: Judgement and insight appears at baseline. Flat mood and affect ? ? ? ?Data Reviewed: I have personally reviewed following labs and imaging studies ? ?CBC: ?Recent Labs  ?Lab 10/26/21 ?1707 10/27/21 ?1962 10/28/21 ?2297 10/29/21 ?9892 10/30/21 ?1194  ?WBC 6.2 5.7 5.8 5.9 7.1  ?HGB 10.5* 9.4* 10.4* 10.2* 12.4*  ?HCT 33.7* 28.7* 31.7* 32.0* 38.3*  ?MCV 90.6 87.0 87.1 85.8 86.7  ?PLT 231 193 210 205 251  ? ?Basic  Metabolic Panel: ?Recent Labs  ?Lab 10/26/21 ?1707 10/27/21 ?1740 10/28/21 ?8144 10/29/21 ?8185 10/30/21 ?6314  ?NA 142 139 140 139 137  ?K 4.0 3.8 4.2 4.1 4.2  ?CL 108 107 109 107 105  ?CO2 '27 27 25 26 23  '$ ?GLUCOSE 119* 90 101* 101* 122*  ?BUN 26* 25* 29* 33* 33*  ?CREATININE 1.55* 1.49* 1.64* 1.66* 1.66*  ?CALCIUM 9.2 8.6* 9.1 9.2 9.5  ? ?GFR: ?Estimated Creatinine Clearance: 30.9 mL/min (A) (by C-G formula based on SCr of 1.66 mg/dL (H)). ?Liver Function Tests: ?No results for input(s): AST, ALT, ALKPHOS, BILITOT, PROT, ALBUMIN in the last 168 hours. ?No results for input(s): LIPASE, AMYLASE in the last 168 hours. ?No results for input(s): AMMONIA in the last 168 hours. ?Coagulation Profile: ?No results for input(s): INR, PROTIME in the last 168 hours. ?Cardiac Enzymes: ?No results for input(s): CKTOTAL, CKMB, CKMBINDEX, TROPONINI in the last 168 hours. ?BNP (last 3 results) ?No results for input(s): PROBNP in the last 8760 hours. ?HbA1C: ?No results for input(s): HGBA1C in the last 72 hours. ?CBG: ?No results for input(s): GLUCAP in the last 168 hours. ?Lipid Profile: ?No results for input(s): CHOL, HDL, LDLCALC, TRIG, CHOLHDL, LDLDIRECT in the last 72 hours. ? ?Thyroid Function Tests: ?No results for input(s): TSH, T4TOTAL, FREET4, T3FREE, THYROIDAB in the last 72 hours. ?Anemia Panel: ?Recent Labs  ?  10/28/21 ?9702  ?FERRITIN 9*  ?TIBC 462*  ?IRON 32*  ? ?Sepsis Labs: ?No results for input(s): PROCALCITON, LATICACIDVEN in the last 168 hours. ? ?Recent Results (from the past 240 hour(s))  ?Resp Panel by RT-PCR (Flu A&B, Covid) Nasopharyngeal Swab     Status: None  ? Collection Time: 10/26/21  6:40 PM  ? Specimen: Nasopharyngeal Swab; Nasopharyngeal(NP) swabs in vial transport medium  ?Result Value Ref Range Status  ? SARS Coronavirus 2 by RT PCR NEGATIVE NEGATIVE Final  ?  Comment: (NOTE) ?SARS-CoV-2 target nucleic acids are NOT DETECTED. ? ?The SARS-CoV-2 RNA is generally detectable in upper  respiratory ?specimens during the acute phase of infection. The lowest ?concentration of SARS-CoV-2 viral copies this assay can detect is ?138 copies/mL. A negative result does not preclude SARS-Cov-2 ?infection and should not be used as the sole basis for treatment or ?other patient management decisions. A negative result may occur with  ?improper specimen collection/handling, submission of specimen other ?than nasopharyngeal swab, presence of viral mutation(s) within the ?areas targeted by this assay, and inadequate number of viral ?copies(<138 copies/mL). A negative result must be combined with ?clinical observations, patient history, and epidemiological ?information. The expected result is Negative. ? ?Fact Sheet for Patients:  ?EntrepreneurPulse.com.au ? ?Fact Sheet for Healthcare Providers:  ?IncredibleEmployment.be ? ?This test is no t yet approved or cleared by the [place] FDA and  ?has been authorized for detection and/or diagnosis of SARS-CoV-2 by ?FDA under an  Emergency Use Authorization (EUA). This EUA will remain  ?in effect (meaning this test can be used) for the duration of the ?COVID-19 declaration under Section 564(b)(1) of the Act, 21 ?U.S.C.section 360bbb-3(b)(1), unless the authorization is terminated  ?or revoked sooner.  ? ? ?  ? Influenza A by PCR NEGATIVE NEGATIVE Final  ? Influenza B by PCR NEGATIVE NEGATIVE Final  ?  Comment: (NOTE) ?The Xpert Xpress SARS-CoV-2/FLU/RSV plus assay is intended as an aid ?in the diagnosis of influenza from Nasopharyngeal swab specimens and ?should not be used as a sole basis for treatment. Nasal washings and ?aspirates are unacceptable for Xpert Xpress SARS-CoV-2/FLU/RSV ?testing. ? ?Fact Sheet for Patients: ?EntrepreneurPulse.com.au ? ?Fact Sheet for Healthcare Providers: ?IncredibleEmployment.be ? ?This test is not yet approved or cleared by the [place] FDA and ?has been  authorized for detection and/or diagnosis of SARS-CoV-2 by ?FDA under an Emergency Use Authorization (EUA). This EUA will remain ?in effect (meaning this test can be used) for the duration of the ?COVID-19 declaration under Section

## 2021-10-30 NOTE — Progress Notes (Signed)
Patient's right groin clipped for procedure.  ?

## 2021-10-30 NOTE — Progress Notes (Signed)
[facility] Cardiology ? ?CARDIOLOGY CONSULT NOTE  ?Patient ID: ?Juan [name] ?MRN: [id number] ?DOB/AGE: Feb 15, 1941 81 y.o. ? ?Admit date: 10/26/2021 ?Referring Physician Amy Cox ?Primary Physician Juluis Pitch, MD ?Primary Cardiologist None ?Reason for Consultation Elevated troponin ? ?HPI:  ?Juan [name] is an 81 year old male with a history of hypertension, CKD, hyperlipidemia, B12 deficiency who presents with chest pain and was discovered to have a mildly elevated troponin. ?The patient has had some anginal symptoms over the last 3 to 4 weeks which are progressive in nature and had full relief with medication management on admission. ? ?Stress test showed significant symptoms with an abnormal moderate reversible anterior myocardial perfusion defect consistent with myocardial ischemia  ? ?\Cardiac catheterization showing critical bifurcating left anterior descending artery stenosis at large diagonal and multiple significant stenoses on a tortuous right coronary artery at up to 95%.  Left circumflex artery normal ? ?Review of systems complete and found to be negative unless listed above  ? ? ? ?Past Medical History:  ?Diagnosis Date  ? Arthritis   ? Cataracts, both eyes   ? 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 History of kidney stones   ? Hypertension   ?  ?Past Surgical History:  ?Procedure Laterality Date  ? APPENDECTOMY    ? COLONOSCOPY WITH PROPOFOL N/A 09/29/2018  ? Procedure: COLONOSCOPY WITH PROPOFOL;  Surgeon: Lollie Sails, MD;  Location: [facility] ENDOSCOPY;  Service: Endoscopy;  Laterality: N/A;  ? COLONOSCOPY WITH PROPOFOL N/A 04/15/2019  ? Procedure: COLONOSCOPY WITH PROPOFOL;  Surgeon: Lollie Sails, MD;  Location: [facility] ENDOSCOPY;  Service: Endoscopy;  Laterality: N/A;  ? WART FULGURATION N/A 06/10/2019  ? Procedure: FULGURATION ANAL WART;  Surgeon: Benjamine Sprague, DO;  Location: [facility] ORS;  Service: General;  Laterality: N/A;  ?  ?Medications Prior to Admission  ?Medication Sig Dispense Refill Last Dose  ?  lisinopril-hydrochlorothiazide (ZESTORETIC) 20-12.5 MG tablet Take 1 tablet by mouth daily.   10/25/2021 at Unknown  ? tamsulosin (FLOMAX) 0.4 MG CAPS capsule Take 0.4 mg by mouth daily.   10/25/2021  ? ?Social History  ? ?Socioeconomic History  ? Marital status: Divorced  ?  Spouse name: Not on file  ? Number of children: Not on file  ? Years of education: Not on file  ? Highest education level: Not on file  ?Occupational History  ? Not on file  ?Tobacco Use  ? Smoking status: Never  ? Smokeless tobacco: Never  ?Vaping Use  ? Vaping Use: Never used  ?Substance and Sexual Activity  ? Alcohol use: No  ? Drug use: No  ? Sexual activity: Not on file  ?Other Topics Concern  ? Not on file  ?Social History Narrative  ? Not on file  ? ?Social Determinants of Health  ? ?Financial Resource Strain: Not on file  ?Food Insecurity: Not on file  ?Transportation Needs: Not on file  ?Physical Activity: Not on file  ?Stress: Not on file  ?Social Connections: Not on file  ?Intimate Partner Violence: Not on file  ?  ?Family History  ?Problem Relation Age of Onset  ? Hypertension Father   ? ? ?PHYSICAL EXAM ? ?General: Elderly Caucasian male. Well developed, well nourished, in no acute distress. ?HEENT:  Normocephalic and atraumatic. Extremely hard of hearing.  ?Neck:  No JVD.  ?Lungs: Clear bilaterally to auscultation . ?Heart: HRRR . Normal S1 and S2 without gallops or murmurs.  ?Abdomen: non distended appearing. ?Msk:  Normal strength and tone for age. ?  Extremities: No obvious clubbing, cyanosis or edema ?Neuro: Alert and oriented X 3. ?Psych:  Good affect, responds appropriately ? ?Labs: ?  ?Lab Results  ?Component Value Date  ? WBC 7.1 10/30/2021  ? HGB 12.4 (L) 10/30/2021  ? HCT 38.3 (L) 10/30/2021  ? MCV 86.7 10/30/2021  ? PLT 251 10/30/2021  ?  ?Recent Labs  ?Lab 10/30/21 ?6629  ?NA 137  ?K 4.2  ?CL 105  ?CO2 23  ?BUN 33*  ?CREATININE 1.66*  ?CALCIUM 9.5  ?GLUCOSE 122*  ? ? ?Lab Results  ?Component Value Date  ? TROPONINI <0.03  04/22/2016  ? ?  ?Lab Results  ?Component Value Date  ? CHOL 208 (H) 10/27/2021  ? ?Lab Results  ?Component Value Date  ? HDL 71 10/27/2021  ? ?Lab Results  ?Component Value Date  ? LDLCALC 124 (H) 10/27/2021  ? ?Lab Results  ?Component Value Date  ? TRIG 63 10/27/2021  ? ?Lab Results  ?Component Value Date  ? CHOLHDL 2.9 10/27/2021  ? ?No results found for: LDLDIRECT  ?  ?Radiology: CARDIAC CATHETERIZATION ? ?Result Date: 10/30/2021 ?  Prox RCA lesion is 75% stenosed.   Mid RCA lesion is 95% stenosed.   Prox RCA to Mid RCA lesion is 60% stenosed.   Prox LAD lesion is 95% stenosed with 95% stenosed side branch in 1st Sept.   LV end diastolic pressure is normal. 81 year old male with hypertension hyperlipidemia with progressive episodes of chest discomfort consistent with unstable angina with an abnormal stress test consistent with anterior myocardial ischemia Echocardiogram showing normal LV systolic function with ejection fraction of 60% and no evidence of significant valvular heart disease Left anterior descending artery having a 95% proximal lesion and a large diagonal branch Right coronary artery with multiple stenoses at 75% proximal and 95% mid Plan Further consultation with cardiovascular surgery for coronary bypass grafting due to tortuosity of his blood vessels and a significant bifurcating stenoses High intensity cholesterol therapy Hypertension control Cardiac rehabilitation  ? ?NM Myocar Multi W/Spect W/Wall Motion / EF ? ?Result Date: 10/30/2021 ?  Findings are consistent with ischemia. The study is high risk.   No ST deviation was noted.   LV perfusion is abnormal. There is evidence of ischemia. Defect 1: There is a medium defect with moderate reduction in uptake present in the apical to mid anterior, anterolateral and anteroseptal location(s) that is reversible. There is normal wall motion in the defect area. Consistent with ischemia.   Left ventricular function is normal. End diastolic cavity size is  normal.   Prior study not available for comparison.  ? ?DG Chest Port 1 View ? ?Result Date: 10/26/2021 ?CLINICAL DATA:  Chest pain EXAM: PORTABLE CHEST 1 VIEW COMPARISON:  04/21/2016 FINDINGS: No focal opacity, pleural effusion, or pneumothorax. Normal cardiomediastinal silhouette. Mild central vascular congestion. Borderline cardiac size. Retrocardiac oval opacity potentially due to hiatal hernia. IMPRESSION: 1. Borderline cardiomegaly with slight central congestion 2. Ovoid retrocardiac opacity, suspect hiatal hernia, though could correlate with lateral view of the chest. Electronically Signed   By: Donavan Foil M.D.   On: 10/26/2021 17:47  ? ?ECHOCARDIOGRAM COMPLETE ? ?Result Date: 10/28/2021 ?   ECHOCARDIOGRAM REPORT   Patient Name:   Juan [name] Date of Exam: 10/28/2021 Medical Rec #:  [id number]       Height:       65.0 in Accession #:    [id number]      Weight:       154.3 lb Date  of Birth:  1941/08/01       BSA:          1.772 m? Patient Age:    10 years        BP:           129/69 mmHg Patient Gender: M               HR:           49 bpm. Exam Location:  [facility] Procedure: 2D Echo Indications:     Chest Pain R07.9                  Elevated Troponin  History:         Patient has no prior history of Echocardiogram examinations.  Sonographer:     Kathlen Brunswick RDCS Referring Phys:  [id number] AMY N COX Diagnosing Phys: Donnelly Angelica IMPRESSIONS  1. Left ventricular ejection fraction, by estimation, is 60 to 65%. The left ventricle has normal function. The left ventricle has no regional wall motion abnormalities. Left ventricular diastolic parameters were normal.  2. Right ventricular systolic function is normal. The right ventricular size is normal.  3. The mitral valve is normal in structure. Mild mitral valve regurgitation. No evidence of mitral stenosis.  4. The aortic valve is normal in structure. Aortic valve regurgitation is trivial. No aortic stenosis is present.  5. The inferior vena cava is normal in size  with greater than 50% respiratory variability, suggesting right atrial pressure of 3 mmHg. FINDINGS  Left Ventricle: Left ventricular ejection fraction, by estimation, is 60 to 65%. The left ventricle has n

## 2021-10-30 NOTE — Discharge Instructions (Addendum)
Patient being transferred to [facility] for CABG ? ?

## 2021-10-31 ENCOUNTER — Other Ambulatory Visit: Payer: Self-pay

## 2021-10-31 ENCOUNTER — Inpatient Hospital Stay (HOSPITAL_COMMUNITY)
Admission: AD | Admit: 2021-10-31 | Discharge: 2021-11-02 | DRG: 247 | Disposition: A | Discharge status: Home / Self Care | Payer: Medicare Other | Source: Other Acute Inpatient Hospital | Attending: Cardiovascular Disease | Admitting: Cardiovascular Disease

## 2021-10-31 ENCOUNTER — Encounter: Payer: Self-pay | Admitting: Internal Medicine

## 2021-10-31 DIAGNOSIS — E785 Hyperlipidemia, unspecified: Secondary | ICD-10-CM | POA: Diagnosis present

## 2021-10-31 DIAGNOSIS — Z79899 Other long term (current) drug therapy: Secondary | ICD-10-CM | POA: Diagnosis not present

## 2021-10-31 DIAGNOSIS — H269 Unspecified cataract: Secondary | ICD-10-CM | POA: Diagnosis present

## 2021-10-31 DIAGNOSIS — I10 Essential (primary) hypertension: Secondary | ICD-10-CM | POA: Diagnosis present

## 2021-10-31 DIAGNOSIS — I129 Hypertensive chronic kidney disease with stage 1 through stage 4 chronic kidney disease, or unspecified chronic kidney disease: Secondary | ICD-10-CM | POA: Diagnosis present

## 2021-10-31 DIAGNOSIS — K649 Unspecified hemorrhoids: Secondary | ICD-10-CM | POA: Diagnosis present

## 2021-10-31 DIAGNOSIS — I214 Non-ST elevation (NSTEMI) myocardial infarction: Principal | ICD-10-CM | POA: Diagnosis present

## 2021-10-31 DIAGNOSIS — N1831 Chronic kidney disease, stage 3a: Secondary | ICD-10-CM | POA: Diagnosis present

## 2021-10-31 DIAGNOSIS — R001 Bradycardia, unspecified: Secondary | ICD-10-CM | POA: Diagnosis present

## 2021-10-31 DIAGNOSIS — Z8249 Family history of ischemic heart disease and other diseases of the circulatory system: Secondary | ICD-10-CM | POA: Diagnosis not present

## 2021-10-31 DIAGNOSIS — I25110 Atherosclerotic heart disease of native coronary artery with unstable angina pectoris: Secondary | ICD-10-CM | POA: Diagnosis present

## 2021-10-31 DIAGNOSIS — Z87442 Personal history of urinary calculi: Secondary | ICD-10-CM

## 2021-10-31 DIAGNOSIS — E538 Deficiency of other specified B group vitamins: Secondary | ICD-10-CM | POA: Diagnosis present

## 2021-10-31 DIAGNOSIS — I2510 Atherosclerotic heart disease of native coronary artery without angina pectoris: Secondary | ICD-10-CM | POA: Diagnosis not present

## 2021-10-31 DIAGNOSIS — I219 Acute myocardial infarction, unspecified: Secondary | ICD-10-CM | POA: Diagnosis present

## 2021-10-31 DIAGNOSIS — R079 Chest pain, unspecified: Secondary | ICD-10-CM | POA: Diagnosis not present

## 2021-10-31 DIAGNOSIS — Z882 Allergy status to sulfonamides status: Secondary | ICD-10-CM | POA: Diagnosis not present

## 2021-10-31 DIAGNOSIS — Z955 Presence of coronary angioplasty implant and graft: Principal | ICD-10-CM

## 2021-10-31 DIAGNOSIS — H9190 Unspecified hearing loss, unspecified ear: Secondary | ICD-10-CM | POA: Diagnosis present

## 2021-10-31 LAB — BASIC METABOLIC PANEL
Anion gap: 6 (ref 5–15)
BUN: 31 mg/dL — ABNORMAL HIGH (ref 8–23)
CO2: 24 mmol/L (ref 22–32)
Calcium: 8.7 mg/dL — ABNORMAL LOW (ref 8.9–10.3)
Chloride: 111 mmol/L (ref 98–111)
Creatinine, Ser: 1.58 mg/dL — ABNORMAL HIGH (ref 0.61–1.24)
GFR, Estimated: 44 mL/min — ABNORMAL LOW (ref >60)
Glucose, Bld: 100 mg/dL — ABNORMAL HIGH (ref 70–99)
Potassium: 4.2 mmol/L (ref 3.5–5.1)
Sodium: 141 mmol/L (ref 135–145)

## 2021-10-31 LAB — HEPARIN LEVEL (UNFRACTIONATED)
Heparin Unfractionated: 0.31 IU/mL (ref 0.30–0.70)
Heparin Unfractionated: 0.34 IU/mL (ref 0.30–0.70)

## 2021-10-31 LAB — CBC
HCT: 32.5 % — ABNORMAL LOW (ref 39.0–52.0)
Hemoglobin: 10.5 g/dL — ABNORMAL LOW (ref 13.0–17.0)
MCH: 28.2 pg (ref 26.0–34.0)
MCHC: 32.3 g/dL (ref 30.0–36.0)
MCV: 87.4 fL (ref 80.0–100.0)
Platelets: 197 10*3/uL (ref 150–400)
RBC: 3.72 MIL/uL — ABNORMAL LOW (ref 4.22–5.81)
RDW: 13.8 % (ref 11.5–15.5)
WBC: 5.9 10*3/uL (ref 4.0–10.5)
nRBC: 0 % (ref 0.0–0.2)

## 2021-10-31 MED ORDER — AMLODIPINE BESYLATE 5 MG PO TABS
5.0000 mg | ORAL_TABLET | Freq: Every day | ORAL | Status: DC
  Administered 2021-11-01 – 2021-11-02 (×2): 5 mg via ORAL
  Filled 2021-10-31 (×2): qty 1

## 2021-10-31 MED ORDER — TAMSULOSIN HCL 0.4 MG PO CAPS
0.4000 mg | ORAL_CAPSULE | Freq: Every day | ORAL | Status: DC
  Administered 2021-11-01 – 2021-11-02 (×2): 0.4 mg via ORAL
  Filled 2021-10-31 (×2): qty 1

## 2021-10-31 MED ORDER — SODIUM CHLORIDE 0.9 % IV SOLN: 250.0000 mL | INTRAVENOUS | Status: DC | PRN

## 2021-10-31 MED ORDER — ASPIRIN 81 MG PO TBEC: 81.0000 mg | DELAYED_RELEASE_TABLET | Freq: Every day | ORAL | 11 refills | Status: DC

## 2021-10-31 MED ORDER — NITROGLYCERIN 0.4 MG SL SUBL: 0.4000 mg | SUBLINGUAL_TABLET | SUBLINGUAL | Status: DC | PRN

## 2021-10-31 MED ORDER — HEPARIN (PORCINE) 25000 UT/250ML-% IV SOLN: INTRAVENOUS | 0 refills | Status: DC

## 2021-10-31 MED ORDER — ACETAMINOPHEN 325 MG PO TABS
650.0000 mg | ORAL_TABLET | ORAL | Status: DC | PRN
  Administered 2021-10-31 – 2021-11-01 (×2): 650 mg via ORAL
  Filled 2021-10-31 (×2): qty 2

## 2021-10-31 MED ORDER — AMLODIPINE BESYLATE 5 MG PO TABS: 5.0000 mg | ORAL_TABLET | Freq: Every day | ORAL | 1 refills | Status: DC

## 2021-10-31 MED ORDER — ONDANSETRON HCL 4 MG/2ML IJ SOLN: 4.0000 mg | Freq: Four times a day (QID) | INTRAMUSCULAR | Status: DC | PRN

## 2021-10-31 MED ORDER — SODIUM CHLORIDE 0.9% FLUSH
3.0000 mL | INTRAVENOUS | Status: DC | PRN
Start: 2021-10-31 — End: 2021-11-02

## 2021-10-31 MED ORDER — SODIUM CHLORIDE 0.9% FLUSH
3.0000 mL | Freq: Two times a day (BID) | INTRAVENOUS | Status: DC
  Administered 2021-11-01: 3 mL via INTRAVENOUS

## 2021-10-31 MED ORDER — HEPARIN (PORCINE) 25000 UT/250ML-% IV SOLN
1150.0000 [IU]/h | INTRAVENOUS | Status: DC
  Administered 2021-10-31: 1050 [IU]/h via INTRAVENOUS
  Filled 2021-10-31: qty 250

## 2021-10-31 MED ORDER — FERROUS GLUCONATE 324 (38 FE) MG PO TABS
324.0000 mg | ORAL_TABLET | Freq: Every day | ORAL | Status: DC
  Administered 2021-11-01 – 2021-11-02 (×2): 324 mg via ORAL
  Filled 2021-10-31 (×2): qty 1

## 2021-10-31 MED ORDER — ASPIRIN EC 81 MG PO TBEC
81.0000 mg | DELAYED_RELEASE_TABLET | Freq: Every day | ORAL | Status: DC
  Administered 2021-11-01 – 2021-11-02 (×2): 81 mg via ORAL
  Filled 2021-10-31 (×2): qty 1

## 2021-10-31 MED ORDER — ATORVASTATIN CALCIUM 80 MG PO TABS: 80.0000 mg | ORAL_TABLET | Freq: Every day | ORAL | 1 refills | Status: DC

## 2021-10-31 MED ORDER — ATORVASTATIN CALCIUM 80 MG PO TABS
80.0000 mg | ORAL_TABLET | Freq: Every day | ORAL | Status: DC
  Administered 2021-11-01 – 2021-11-02 (×2): 80 mg via ORAL
  Filled 2021-10-31 (×2): qty 1

## 2021-10-31 NOTE — Consult Note (Signed)
ANTICOAGULATION CONSULT NOTE  ? ?Pharmacy Consult for Heparin  ?Indication: chest pain/ACS ? ?Allergies  ?Allergen Reactions  ? Sulfa Antibiotics Other (See Comments)  ?  Unknown reaction  ? ? ?Patient Measurements: ?Height: '5\' 5"'$  (165.1 cm) ?Weight: 63.3 kg (139 lb 9.6 oz) ?IBW/kg (Calculated) : 61.5 ?Heparin Dosing Weight: 70 kg ? ?Vital Signs: ?Temp: 97.8 ?F (36.6 ?C) (03/15 0725) ?BP: 127/106 (03/15 0725) ?Pulse Rate: 56 (03/15 0725) ? ?Labs: ?Recent Labs  ?  10/28/21 ?1344 10/29/21 ?4765 10/29/21 ?4650 10/30/21 ?3546 10/31/21 ?0107 10/31/21 ?5681 10/31/21 ?0910  ?HGB  --  10.2*   < > 12.4*  --  10.5*  --   ?HCT  --  32.0*  --  38.3*  --  32.5*  --   ?PLT  --  205  --  251  --  197  --   ?HEPARINUNFRC 0.52  --   --   --  0.31  --  0.34  ?CREATININE  --  1.66*  --  1.66*  --  1.58*  --   ? < > = values in this interval not displayed.  ? ? ? ?Estimated Creatinine Clearance: 32.4 mL/min (A) (by C-G formula based on SCr of 1.58 mg/dL (H)). ? ? ?Medical History: ?Past Medical History:  ?Diagnosis Date  ? Arthritis   ? Cataracts, both eyes   ? 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 History of kidney stones   ? Hypertension   ? ? ?Medications:  ?Medications Prior to Admission  ?Medication Sig Dispense Refill Last Dose  ? lisinopril-hydrochlorothiazide (ZESTORETIC) 20-12.5 MG tablet Take 1 tablet by mouth daily.   10/25/2021 at Unknown  ? tamsulosin (FLOMAX) 0.4 MG CAPS capsule Take 0.4 mg by mouth daily.   10/25/2021  ? No anticoagulants documented in medical history from PCP or from pharmacy refill records. ? ?Assessment: ?81yrold male with HTN, CKD3a, HLD presents to ED for evaluation of severe chest pain as result of heavy exertion. Stress test was positive, now S/p cardiac catheterization. Cardiology planning for CABG. Pharmacy consulted for management of heparin prior to procedure. ? 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  ? ?CBC stable (plts 197), no note of bleeding per my chart review. ? ? ?Goal of Therapy:  ?Heparin level 0.3-0.7 units/ml ?Monitor platelets by anticoagulation protocol: Yes ? ?3/11 0514 HL 0.40, therapeutic x 1 ?3/11 1245 HL 0.36, therapeutic x 2 ?3/12 0521 HL 0.29, subtherapeutic/ 850 > 1000 units/hr ?3/12 1344 HL 0.52, therapeutic  ?3/12 2112 Heparin drip stopped ?3/14 1320 Heparin 3000 units x1 ?3/15 0107 HL 0.31, therapeutic X 1  ?3/15 0907 HL 0.34 Therapeutic x 1 ? ?Plan: therapeutic x 2 ?Continue heparin 1,000 units/hr ?Daily HL, CBC ? ? ?CWynelle Cleveland PharmD ?Pharmacy Resident  ?10/31/2021 ?10:23 AM ? ? ? ? ? ? ? ?

## 2021-10-31 NOTE — Discharge Summary (Signed)
Physician Discharge Summary   Patient: Juan [name] MRN: [id number] DOB: August 31, 1940  Admit date:     10/26/2021  Discharge date: 10/31/21  Discharge Physician: Cipriano Bunker   PCP: Dorothey Baseman, MD   Recommendations at discharge:  Patient being transferred to [facility] for CABG. 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 [facility] Course: Juan [name] is an 81 years old male with PMH significant for hypertension, hyperlipidemia, vitamin B12 deficiency presented in the ED with chief complaints of chest pain. Patient does have anginal symptoms over last 3 to 4 weeks which are progressive in nature and had full relief with medications.  EKG was non ischemic.  Troponins mildly elevated.  Cardiologist recommended stress test.  Stress test showed significant symptoms with abnormal moderate reversible anterior myocardial perfusion defect consistent with myocardial ischemia.  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  Patient has completed IV heparin for 48 hours.  Patient was continued on aspirin, atorvastatin.  Patient is being transferred to [facility] for possible coronary artery bypass grafting.   Assessment and Plan: No notes have been filed under this hospital service. Service: Hospitalist  NSTEMI: Patient presented with typical anginal chest pain, elevated troponin EKG nonischemic. Cardiology consulted patient underwent stress test.  Which shows anterior myocardial perfusion abnormality. Patient underwent left heart cath  found to have significant three-vessel disease. Cardiology recommended transfer to [facility] for possible CABG. Patient has completed 48 hours of IV heparin therapy. Continue aspirin, statins.  Essential hypertension: Continue amlodipine.  CKD stage IIIa: Serum creatinine stable.  Avoid nephrotoxic medications.  Hyperlipidemia: Statins.  Consultants: Cardiology Procedures performed: Left heart cath Disposition:  Juan [name] for CABG Diet recommendation:  Discharge Diet Orders (From admission, onward)     Start     Ordered   10/31/21 0000  Diet - low sodium heart healthy        10/31/21 1335   10/31/21 0000  Diet - low sodium heart healthy        10/31/21 1336   10/31/21 0000  Diet Carb Modified        10/31/21 1336           Carb modified diet DISCHARGE MEDICATION: Allergies as of 10/31/2021       Reactions   Sulfa Antibiotics Other (See Comments)   Unknown reaction        Medication List     STOP taking these medications    lisinopril-hydrochlorothiazide 20-12.5 MG tablet Commonly known as: ZESTORETIC   tamsulosin 0.4 MG Caps capsule Commonly known as: FLOMAX       TAKE these medications    amLODipine 5 MG tablet Commonly known as: NORVASC Take 1 tablet (5 mg total) by mouth daily. Start taking on: November 01, 2021   aspirin 81 MG EC tablet Take 1 tablet (81 mg total) by mouth daily. Swallow whole. Start taking on: November 01, 2021   atorvastatin 80 MG tablet Commonly known as: LIPITOR Take 1 tablet (80 mg total) by mouth daily. Start taking on: November 01, 2021   heparin  25000 UT/250ML infusion Continue heparin is being transferred to [facility] for CABG        Follow-up Information     Dorothey Baseman, MD Follow up in 1 week(s).   Specialty: Family Medicine Contact information: [address] [place] [address] [phone]                Discharge Exam: Filed Weights   10/26/21 1851 10/30/21 0328 10/30/21  1216  Weight: 70 kg 63.3 kg 63.3 kg   General exam: Appears comfortable, not in any acute distress.  Deconditioned, hard of hearing. Respiratory system: CTA bilaterally, no wheezing, no crackles, respiratory for normal. Cardiovascular system: S1-S2 heard, regular rate and rhythm, no murmur. Gastrointestinal system: Abdomen is soft, non tender, non distended, BS+ Central nervous system: Alert, oriented x3, no focal neurological deficits. Extremities: No edema, no cyanosis, no clubbing. Psychiatry: Mood, insight, judgment normal.   Condition at discharge: good  The results of significant diagnostics from this hospitalization (including imaging, microbiology, ancillary and laboratory) are listed below for reference.   Imaging Studies: CARDIAC CATHETERIZATION  Result Date: 10/30/2021   Prox RCA lesion is 75% stenosed.   Mid RCA lesion is 95% stenosed.   Prox RCA to Mid RCA lesion is 60% stenosed.   Prox LAD lesion is 95% stenosed with 95% stenosed side branch in 1st Sept.   LV end diastolic pressure is normal. 81 year old male with hypertension hyperlipidemia with progressive episodes of chest discomfort consistent with unstable angina with an abnormal stress test consistent with anterior myocardial ischemia Echocardiogram showing normal LV systolic function with ejection fraction of 60% and no evidence of significant valvular heart disease Left anterior descending artery having a 95% proximal lesion and a large diagonal branch Right coronary artery with multiple stenoses at 75% proximal and 95% mid Plan Further consultation with cardiovascular surgery for coronary bypass grafting due to tortuosity of his blood vessels and a significant bifurcating stenoses High intensity cholesterol therapy Hypertension control Cardiac rehabilitation   NM Myocar Multi W/Spect W/Wall Motion / EF  Result Date: 10/30/2021   Findings are consistent with ischemia. The study is high risk.   No ST deviation was noted.    LV perfusion is abnormal. There is evidence of ischemia. Defect 1: There is a medium defect with moderate reduction in uptake present in the apical to mid anterior, anterolateral and anteroseptal location(s) that is reversible. There is normal wall motion in the defect area. Consistent with ischemia.   Left ventricular function is normal. End diastolic cavity size is normal.   Prior study not available for comparison.   DG Chest Port 1 View  Result Date: 10/26/2021 CLINICAL DATA:  Chest pain EXAM: PORTABLE CHEST 1 VIEW COMPARISON:  04/21/2016 FINDINGS: No focal opacity, pleural effusion, or pneumothorax. Normal cardiomediastinal silhouette. Mild central vascular congestion. Borderline cardiac size. Retrocardiac oval opacity potentially due to hiatal hernia. IMPRESSION: 1. Borderline cardiomegaly with slight central congestion 2. Ovoid retrocardiac opacity, suspect hiatal hernia, though could correlate with lateral view of the chest. Electronically Signed   By: Jasmine Pang M.D.   On: 10/26/2021 17:47   ECHOCARDIOGRAM COMPLETE  Result Date: 10/28/2021    ECHOCARDIOGRAM REPORT   Patient Name:   KAHLER WEHBE Date of Exam: 10/28/2021 Medical Rec #:  [id number]       Height:       65.0 in Accession #:    [id number]      Weight:       154.3 lb Date  of Birth:  1941/04/16       BSA:          1.772 m Patient Age:    80 years        BP:           129/69 mmHg Patient Gender: M               HR:           49 bpm. Exam Location:  [facility] Procedure: 2D Echo Indications:     Chest Pain R07.9                  Elevated Troponin  History:         Patient has no prior history of Echocardiogram examinations.  Sonographer:     Overton Mam RDCS Referring Phys:  [id number] AMY N COX Diagnosing Phys: Sena Slate IMPRESSIONS  1. Left ventricular ejection fraction, by estimation, is 60 to 65%. The left ventricle has normal function. The left ventricle has no regional wall motion abnormalities. Left ventricular diastolic parameters  were normal.  2. Right ventricular systolic function is normal. The right ventricular size is normal.  3. The mitral valve is normal in structure. Mild mitral valve regurgitation. No evidence of mitral stenosis.  4. The aortic valve is normal in structure. Aortic valve regurgitation is trivial. No aortic stenosis is present.  5. The inferior vena cava is normal in size with greater than 50% respiratory variability, suggesting right atrial pressure of 3 mmHg. FINDINGS  Left Ventricle: Left ventricular ejection fraction, by estimation, is 60 to 65%. The left ventricle has normal function. The left ventricle has no regional wall motion abnormalities. The left ventricular internal cavity size was normal in size. There is  no left ventricular hypertrophy. Left ventricular diastolic parameters were normal. Right Ventricle: The right ventricular size is normal. No increase in right ventricular wall thickness. Right ventricular systolic function is normal. Left Atrium: Left atrial size was normal in size. Right Atrium: Right atrial size was normal in size. Pericardium: There is no evidence of pericardial effusion. Mitral Valve: The mitral valve is normal in structure. Mild mitral valve regurgitation. No evidence of mitral valve stenosis. Tricuspid Valve: The tricuspid valve is normal in structure. Tricuspid valve regurgitation is trivial. No evidence of tricuspid stenosis. Aortic Valve: The aortic valve is normal in structure. Aortic valve regurgitation is trivial. No aortic stenosis is present. Aortic valve peak gradient measures 7.5 mmHg. Pulmonic Valve: The pulmonic valve was not well visualized. Pulmonic valve regurgitation is mild. No evidence of pulmonic stenosis. Aorta: The aortic root is normal in size and structure. Venous: The inferior vena cava is normal in size with greater than 50% respiratory variability, suggesting right atrial pressure of 3 mmHg. IAS/Shunts: No atrial level shunt detected by color flow  Doppler.  LEFT VENTRICLE PLAX 2D LVIDd:         4.74 cm Diastology LVIDs:         3.10 cm LV e' medial:    8.27 cm/s LV PW:         0.96 cm LV E/e' medial:  10.4 LV IVS:        0.93 cm LV e' lateral:   9.57 cm/s                        LV E/e' lateral: 9.0  RIGHT VENTRICLE RV Basal diam:  3.00 cm RV S prime:     14.40  cm/s TAPSE (M-mode): 1.9 cm LEFT ATRIUM           Index        RIGHT ATRIUM           Index LA diam:      3.10 cm 1.75 cm/m   RA Area:     13.90 cm LA Vol (A2C): 53.8 ml 30.37 ml/m  RA Volume:   34.50 ml  19.47 ml/m LA Vol (A4C): 56.3 ml 31.78 ml/m  AORTIC VALVE              PULMONIC VALVE AV Vmax:      137.09 cm/s PV Vmax:          1.23 m/s AV Peak Grad: 7.5 mmHg    PV Peak grad:     6.1 mmHg LVOT Vmax:    110.00 cm/s PR End Diast Vel: 2.31 msec LVOT Vmean:   76.600 cm/s LVOT VTI:     0.252 m  AORTA Ao Root diam: 2.80 cm Ao Asc diam:  2.90 cm MITRAL VALVE               TRICUSPID VALVE MV Area (PHT): 3.65 cm    TV Peak grad:   24.0 mmHg MV Decel Time: 208 msec    TV Vmax:        2.45 m/s MV E velocity: 85.70 cm/s MV A velocity: 56.10 cm/s  SHUNTS MV E/A ratio:  1.53        Systemic VTI: 0.25 m Sena Slate Electronically signed by Sena Slate Signature Date/Time: 10/28/2021/12:40:59 PM    Final     Microbiology: Results for orders placed or performed during the hospital encounter of 10/26/21  Resp Panel by RT-PCR (Flu A&B, Covid) Nasopharyngeal Swab     Status: None   Collection Time: 10/26/21  6:40 PM   Specimen: Nasopharyngeal Swab; Nasopharyngeal(NP) swabs in vial transport medium  Result Value Ref Range Status   SARS Coronavirus 2 by RT PCR NEGATIVE NEGATIVE Final    Comment: (NOTE) SARS-CoV-2 target nucleic acids are NOT DETECTED.  The SARS-CoV-2 RNA is generally detectable in upper respiratory specimens during the acute phase of infection. The lowest concentration of SARS-CoV-2 viral copies this assay can detect is 138 copies/mL. A negative result does not preclude  SARS-Cov-2 infection and should not be used as the sole basis for treatment or other patient management decisions. A negative result may occur with  improper specimen collection/handling, submission of specimen other than nasopharyngeal swab, presence of viral mutation(s) within the areas targeted by this assay, and inadequate number of viral copies(<138 copies/mL). A negative result must be combined with clinical observations, patient history, and epidemiological information. The expected result is Negative.  Fact Sheet for Patients:  BloggerCourse.com  Fact Sheet for Healthcare Providers:  SeriousBroker.it  This test is no t yet approved or cleared by the [place] FDA and  has been authorized for detection and/or diagnosis of SARS-CoV-2 by FDA under an Emergency Use Authorization (EUA). This EUA will remain  in effect (meaning this test can be used) for the duration of the COVID-19 declaration under Section 564(b)(1) of the Act, 21 U.S.C.section 360bbb-3(b)(1), unless the authorization is terminated  or revoked sooner.       Influenza A by PCR NEGATIVE NEGATIVE Final   Influenza B by PCR NEGATIVE NEGATIVE Final    Comment: (NOTE) The Xpert Xpress SARS-CoV-2/FLU/RSV plus assay is intended as an aid in the diagnosis of influenza from Nasopharyngeal swab specimens  and should not be used as a sole basis for treatment. Nasal washings and aspirates are unacceptable for Xpert Xpress SARS-CoV-2/FLU/RSV testing.  Fact Sheet for Patients: BloggerCourse.com  Fact Sheet for Healthcare Providers: SeriousBroker.it  This test is not yet approved or cleared by the [place] FDA and has been authorized for detection and/or diagnosis of SARS-CoV-2 by FDA under an Emergency Use Authorization (EUA). This EUA will remain in effect (meaning this test can be used) for the duration of  the COVID-19 declaration under Section 564(b)(1) of the Act, 21 U.S.C. section 360bbb-3(b)(1), unless the authorization is terminated or revoked.  Performed at [facility], [address] [place], [place] [address]     Labs: CBC: Recent Labs  Lab 10/27/21 [phone] 10/28/21 0521 10/29/21 0508 10/30/21 0553 10/31/21 0558  WBC 5.7 5.8 5.9 7.1 5.9  HGB 9.4* 10.4* 10.2* 12.4* 10.5*  HCT 28.7* 31.7* 32.0* 38.3* 32.5*  MCV 87.0 87.1 85.8 86.7 87.4  PLT 193 210 205 251 197   Basic Metabolic Panel: Recent Labs  Lab 10/27/21 0514 10/28/21 0521 10/29/21 0508 10/30/21 0553 10/31/21 0558  NA 139 140 139 137 141  K 3.8 4.2 4.1 4.2 4.2  CL 107 109 107 105 111  CO2 27 25 26 23 24   GLUCOSE 90 101* 101* 122* 100*  BUN 25* 29* 33* 33* 31*  CREATININE 1.49* 1.64* 1.66* 1.66* 1.58*  CALCIUM 8.6* 9.1 9.2 9.5 8.7*   Liver Function Tests: No results for input(s): AST, ALT, ALKPHOS, BILITOT, PROT, ALBUMIN in the last 168 hours. CBG: No results for input(s): GLUCAP in the last 168 hours.  Discharge time spent: greater than 30 minutes.  Signed: Cipriano Bunker, MD [facility] 10/31/2021

## 2021-10-31 NOTE — H&P (Signed)
?Cardiology Admission History and Physical:  ? ?Patient ID: Juan [name] ?MRN: [id number]; DOB: 08/13/1941  ? ?Admission date: (Not on file) ? ?PCP:  Juluis Pitch, MD ?  ?[facility] Providers ?Cardiologist:  Dr. Nehemiah Massed ? ?Chief Complaint:  Chest pain ? ?Patient Profile:  ? ?Juan [name] is a 81 y.o. 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 ? ?History of Present Illness:  ? ?Juan [name] is an 81 yo male with PMH noted above. He has not been followed by cardiology in the past. He presented to [facility] with complaints of chest pain. Reported being in his usual state of health until the day prior to admission when he developed chest pain. Was working on his car and unloading an air tank when he developed chest pain that lasted about 10-15 minutes and resolved with rest. Did have associated shortness of breath with episode. Also felt as though he may pass out.  ? ?In the ED his labs showed Na 142, K 4.0, Cr 1.55, hsTn 25>>58>>32, WBC 6.2, Hgb 10.5>>9.4, BNP 129. EKG showed SB without ischemic changes.  ? ?He also reported an episode of hematochezia which he thought was hemorrhoidal.  ? ?He was evaluated by Dr. Corky Sox with [facility] cardiology with recommendations for NM stress test on 3/13 which showed abnormal moderate reversible anterior myocardial perfusion defect consistent with myocardial ischemia. He was therefore recommended to undergo cardiac catheterization. 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 Left circumflex artery normal. This was discussed with [facility] at [facility] with recommendations to transfer for further evaluation for possible CABG.  ? ? ?Past Medical History:  ?Diagnosis Date  ? Arthritis   ? Cataracts, both eyes   ? 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 History of kidney  stones   ? Hypertension   ? ? ?Past Surgical History:  ?Procedure Laterality Date  ? APPENDECTOMY    ? COLONOSCOPY WITH PROPOFOL N/A 09/29/2018  ? Procedure: COLONOSCOPY WITH PROPOFOL;  Surgeon: Lollie Sails, MD;  Location: [facility] ENDOSCOPY;  Service: Endoscopy;  Laterality: N/A;  ? COLONOSCOPY WITH PROPOFOL N/A 04/15/2019  ? Procedure: COLONOSCOPY WITH PROPOFOL;  Surgeon: Lollie Sails, MD;  Location: [facility] ENDOSCOPY;  Service: Endoscopy;  Laterality: N/A;  ? LEFT HEART CATH AND CORONARY ANGIOGRAPHY N/A 10/30/2021  ? Procedure: LEFT HEART CATH AND CORONARY ANGIOGRAPHY;  Surgeon: Corey Skains, MD;  Location: [facility] CV LAB;  Service: Cardiovascular;  Laterality: N/A;  ? WART FULGURATION N/A 06/10/2019  ? Procedure: FULGURATION ANAL WART;  Surgeon: Benjamine Sprague, DO;  Location: [facility] ORS;  Service: General;  Laterality: N/A;  ?  ? ?Medications Prior to Admission: ?Prior to Admission medications   ?Medication Sig Start Date End Date Taking? Authorizing Provider  ?amLODipine (NORVASC) 5 MG tablet Take 1 tablet (5 mg total) by mouth daily. 11/01/21   Shawna Clamp, MD  ?aspirin EC 81 MG EC tablet Take 1 tablet (81 mg total) by mouth daily. Swallow whole. 11/01/21   Shawna Clamp, MD  ?atorvastatin (LIPITOR) 80 MG tablet Take 1 tablet (80 mg total) by mouth daily. 11/01/21   Shawna Clamp, MD  ?heparin 25000 UT/250ML infusion Continue heparin is being transferred to [facility] for CABG 10/31/21   Shawna Clamp, MD  ?lisinopril-hydrochlorothiazide (ZESTORETIC) 20-12.5 MG tablet Take  1 tablet by mouth daily.    [provider]  ?tamsulosin (FLOMAX) 0.4 MG CAPS capsule Take 0.4 mg by mouth daily.    [provider]  ?  ? ?Allergies:    ?Allergies  ?Allergen Reactions  ? Sulfa Antibiotics Other (See Comments)  ?  Unknown reaction  ? ? ?Social History:   ?Social History  ? ?Socioeconomic History  ? Marital status: Divorced  ?  Spouse name: Not on file  ? Number of children: Not on file  ? Years  of education: Not on file  ? Highest education level: Not on file  ?Occupational History  ? Not on file  ?Tobacco Use  ? Smoking status: Never  ? Smokeless tobacco: Never  ?Vaping Use  ? Vaping Use: Never used  ?Substance and Sexual Activity  ? Alcohol use: No  ? Drug use: No  ? Sexual activity: Not on file  ?Other Topics Concern  ? Not on file  ?Social History Narrative  ? Not on file  ? ?Social Determinants of Health  ? ?Financial Resource Strain: Not on file  ?Food Insecurity: Not on file  ?Transportation Needs: Not on file  ?Physical Activity: Not on file  ?Stress: Not on file  ?Social Connections: Not on file  ?Intimate Partner Violence: Not on file  ?  ?Family History:   ?The patient's family history includes Hypertension in his father.   ? ?ROS:  ?Please see the history of present illness.  ?All other ROS reviewed and negative.    ? ?Physical Exam/Data:  ?There were no vitals filed for this visit. ?No intake or output data in the 24 hours ending 10/31/21 1535 ?Last 3 Weights 10/30/2021 10/30/2021 10/26/2021  ?Weight (lbs) 139 lb 9.6 oz 139 lb 9.6 oz 154 lb 5.2 oz  ?Weight (kg) 63.322 kg 63.322 kg 70 kg  ?   ?There is no height or weight on file to calculate BMI.  ?General:  Well nourished, well developed, elderly hard of hearing male in no acute distress ?HEENT: normal ?Neck: no JVD ?Vascular: No carotid bruits; Distal pulses 2+ bilaterally   ?Cardiac:  normal S1, S2; RRR; no murmur  ?Lungs:  clear to auscultation bilaterally, no wheezing, rhonchi or rales  ?Abd: soft, nontender, no hepatomegaly  ?Ext: no edema ?Musculoskeletal:  No deformities, BUE and BLE strength normal and equal ?Skin: warm and dry  ?Neuro:  CNs 2-12 intact, no focal abnormalities noted ?Psych:  Normal affect  ? ? ?EKG:  The ECG that was done 10/26/21 was personally reviewed and demonstrates sinus bradycardia, T wave abnormality consider inferior ischemia ? ?Relevant CV Studies: ? ?Cath: 10/30/21 ? ?  Prox RCA lesion is 75% stenosed. ?  Mid  RCA lesion is 95% stenosed. ?  Prox RCA to Mid RCA lesion is 60% stenosed. ?  Prox LAD lesion is 95% stenosed with 95% stenosed side branch in 1st Sept. ?  LV end diastolic pressure is normal. ?  ?81 year old male with hypertension hyperlipidemia with progressive episodes of chest discomfort consistent with unstable angina with an abnormal stress test consistent with anterior myocardial ischemia ?  ?Echocardiogram showing normal LV systolic function with ejection fraction of 60% and no evidence of significant valvular heart disease ?  ?Left anterior descending artery having a 95% proximal lesion and a large diagonal branch ?Right coronary artery with multiple stenoses at 75% proximal and 95% mid ?  ?Plan ?Further consultation with cardiovascular surgery for coronary bypass grafting due to tortuosity of his blood  vessels and a significant bifurcating stenoses ?High intensity cholesterol therapy ?Hypertension control ?Cardiac rehabilitation ? ?Diagnostic ?Dominance: Right ? ?Echo: 10/26/2021 ? ?IMPRESSIONS  ? ? ? 1. Left ventricular ejection fraction, by estimation, is 60 to 65%. The  ?left ventricle has normal function. The left ventricle has no regional  ?wall motion abnormalities. Left ventricular diastolic parameters were  ?normal.  ? 2. Right ventricular systolic function is normal. The right ventricular  ?size is normal.  ? 3. The mitral valve is normal in structure. Mild mitral valve  ?regurgitation. No evidence of mitral stenosis.  ? 4. The aortic valve is normal in structure. Aortic valve regurgitation is  ?trivial. No aortic stenosis is present.  ? 5. The inferior vena cava is normal in size with greater than 50%  ?respiratory variability, suggesting right atrial pressure of 3 mmHg.  ? ?FINDINGS  ? Left Ventricle: Left ventricular ejection fraction, by estimation, is 60  ?to 65%. The left ventricle has normal function. The left ventricle has no  ?regional wall motion abnormalities. The left ventricular  internal cavity  ?size was normal in size. There is  ? no left ventricular hypertrophy. Left ventricular diastolic parameters  ?were normal.  ? ?Right Ventricle: The right ventricular size is normal. No increa

## 2021-10-31 NOTE — Progress Notes (Signed)
Bed assignment provided at [facility] 05/ report called to floor/ [facility] to transport / MD and pts son made aware ?

## 2021-10-31 NOTE — Consult Note (Signed)
ANTICOAGULATION CONSULT NOTE  ? ?Pharmacy Consult for Heparin  ?Indication: chest pain/ACS ? ?Allergies  ?Allergen Reactions  ? Sulfa Antibiotics Other (See Comments)  ?  Unknown reaction  ? ? ?Patient Measurements: ?Height: '5\' 5"'$  (165.1 cm) ?Weight: 63.3 kg (139 lb 9.6 oz) ?IBW/kg (Calculated) : 61.5 ?Heparin Dosing Weight: 70 kg ? ?Vital Signs: ?Temp: 98.4 ?F (36.9 ?C) (03/15 0002) ?BP: 124/63 (03/15 0002) ?Pulse Rate: 55 (03/15 0002) ? ?Labs: ?Recent Labs  ?  10/28/21 ?2637 10/28/21 ?1344 10/29/21 ?8588 10/30/21 ?5027 10/31/21 ?0107  ?HGB 10.4*  --  10.2* 12.4*  --   ?HCT 31.7*  --  32.0* 38.3*  --   ?PLT 210  --  205 251  --   ?HEPARINUNFRC 0.29* 0.52  --   --  0.31  ?CREATININE 1.64*  --  1.66* 1.66*  --   ? ? ? ?Estimated Creatinine Clearance: 30.9 mL/min (A) (by C-G formula based on SCr of 1.66 mg/dL (H)). ? ? ?Medical History: ?Past Medical History:  ?Diagnosis Date  ? Arthritis   ? Cataracts, both eyes   ? 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 History of kidney stones   ? Hypertension   ? ? ?Medications:  ?Medications Prior to Admission  ?Medication Sig Dispense Refill Last Dose  ? lisinopril-hydrochlorothiazide (ZESTORETIC) 20-12.5 MG tablet Take 1 tablet by mouth daily.   10/25/2021 at Unknown  ? tamsulosin (FLOMAX) 0.4 MG CAPS capsule Take 0.4 mg by mouth daily.   10/25/2021  ? No anticoagulants documented in medical history from PCP or from pharmacy refill records. ? ?Assessment: ?81yrold male with HTN, CKD3a, HLD presents to ED for evaluation of severe chest pain as result of heavy exertion. Cardiac catheterization showing critical bifurcating left anterior descending artery stenosis at large diagonal and multiple significant stenoses on a tortuous right coronary artery at up to 95%. Cardiology plans for CABG for unstable angina in the setting of elevated troponin and severe two-vessel coronary artery disease with tortuous right and left anterior descending artery stenoses. Pharmacy consulted for management of  heparin in the setting of unstable angina.  ? ? ?Goal of Therapy:  ?Heparin level 0.3-0.7 units/ml ?Monitor platelets by anticoagulation protocol: Yes ? ?3/11 0514 HL 0.40, therapeutic x 1 ?3/11 1245 HL 0.36, therapeutic x 2 ?3/12 0521 HL 0.29, subtherapeutic/ 850 > 1000 units/hr ?3/12 1344 HL 0.52, therapeutic  ?3/12 2112 Heparin drip stopped ?3/14 1320 Heparin 3000 units x1 ?3/15 0107 HL 0.31, therapeutic X 1  ? ?Plan:  ?3/15: HL @ 0107 = 0.31, therapeutic X 1  ?Will continue pt on current rate and draw confirmation level in 8 hrs.  ? ?Juan [name] D, PharmD, ?Clinical Pharmacist ?10/31/2021 ?1:52 AM ? ? ? ? ? ? ?

## 2021-10-31 NOTE — Progress Notes (Signed)
?  Subjective: ?Patient examined, images of coronary angiogram and echocardiogram personally reviewed and discussed with patient and his children. ?Patient has acute coronary syndrome with significant two-vessel CAD and preserved LV function.  At age 81 years old, deaf ,with stage III chronic kidney disease and with very small LAD target for grafting I would recommend PCI.  The very large dominant RCA is probably the culprit vessel and should be amenable to PCI. ?I will communicate with Dr. Burt Knack tomorrow.  The patient's son and daughter both understand and agree as they are very ?apprehensive about him undergoing CABG. ?Objective: ?Vital signs in last 24 hours: ?Temp:  [97.7 ?F (36.5 ?C)-98.7 ?F (37.1 ?C)] 98 ?F (36.7 ?C) (03/15 1458) ?Pulse Rate:  [55-67] 67 (03/15 1458) ?Cardiac Rhythm: Normal sinus rhythm (03/15 1711) ?Resp:  [14-18] 17 (03/15 1458) ?BP: (119-140)/(63-106) 140/71 (03/15 1458) ?SpO2:  [97 %-99 %] 98 % (03/15 1458) ? ?Hemodynamic parameters for last 24 hours: ?  ? ?Intake/Output from previous day: ?No intake/output data recorded. ?Intake/Output this shift: ?No intake/output data recorded. ?Patient very deaf, difficult to communicate ?Soft 2/6 systolic flow murmur right upper sternal border ?Lungs clear ?Lab Results: ?Recent Labs  ?  10/30/21 ?5003 10/31/21 ?7048  ?WBC 7.1 5.9  ?HGB 12.4* 10.5*  ?HCT 38.3* 32.5*  ?PLT 251 197  ? ?BMET:  ?Recent Labs  ?  10/30/21 ?8891 10/31/21 ?6945  ?NA 137 141  ?K 4.2 4.2  ?CL 105 111  ?CO2 23 24  ?GLUCOSE 122* 100*  ?BUN 33* 31*  ?CREATININE 1.66* 1.58*  ?CALCIUM 9.5 8.7*  ?  ?PT/INR: No results for input(s): LABPROT, INR in the last 72 hours. ?ABG ?No results found for: PHART, HCO3, TCO2, ACIDBASEDEF, O2SAT ?CBG (last 3)  ?No results for input(s): GLUCAP in the last 72 hours. ? ?Assessment/Plan: ?Unstable angina with severe CAD. Will discuss PCI with Dr. Burt Knack tomorrow as discussed above. ? ? ? LOS: 0 days  ? ? ?Juan [name] ?10/31/2021 ?  ?

## 2021-10-31 NOTE — Care Management Important Message (Signed)
Important Message ? ?Patient Details  ?Name: Juan [name] ?MRN: [id number] ?Date of Birth: 08-04-1941 ? ? ?Medicare Important Message Given:  Yes ? ? ? ? ?Dannette Barbara ?10/31/2021, 10:47 AM ?

## 2021-10-31 NOTE — Progress Notes (Signed)
[facility] Cardiology ? ?CARDIOLOGY CONSULT NOTE  ?Patient ID: ?Juan [name] ?MRN: [id number] ?DOB/AGE: Jun 06, 1941 81 y.o. ? ?Admit date: 10/26/2021 ?Referring Physician Amy Cox ?Primary Physician Juluis Pitch, MD ?Primary Cardiologist None ?Reason for Consultation Elevated troponin ? ?HPI:  ?Juan [name] is an 81 year old male with a history of hypertension, CKD, hyperlipidemia, B12 deficiency who presents with chest pain and was discovered to have a mildly elevated troponin. ?The patient has had some anginal symptoms over the last 3 to 4 weeks which are progressive in nature and had full relief with medication management on admission. ? ?Stress test showed significant symptoms with an abnormal moderate reversible anterior myocardial perfusion defect consistent with myocardial ischemia  ? ?\Cardiac catheterization showing critical bifurcating left anterior descending artery stenosis at large diagonal and multiple significant stenoses on a tortuous right coronary artery at up to 95%.  Left circumflex artery normal ? ?Review of systems complete and found to be negative unless listed above  ? ? ? ?Past Medical History:  ?Diagnosis Date  ? Arthritis   ? Cataracts, both eyes   ? 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 History of kidney stones   ? Hypertension   ?  ?Past Surgical History:  ?Procedure Laterality Date  ? APPENDECTOMY    ? COLONOSCOPY WITH PROPOFOL N/A 09/29/2018  ? Procedure: COLONOSCOPY WITH PROPOFOL;  Surgeon: Lollie Sails, MD;  Location: [facility] ENDOSCOPY;  Service: Endoscopy;  Laterality: N/A;  ? COLONOSCOPY WITH PROPOFOL N/A 04/15/2019  ? Procedure: COLONOSCOPY WITH PROPOFOL;  Surgeon: Lollie Sails, MD;  Location: [facility] ENDOSCOPY;  Service: Endoscopy;  Laterality: N/A;  ? LEFT HEART CATH AND CORONARY ANGIOGRAPHY N/A 10/30/2021  ? Procedure: LEFT HEART CATH AND CORONARY ANGIOGRAPHY;  Surgeon: Corey Skains, MD;  Location: [facility];  Service: Cardiovascular;  Laterality: N/A;  ? WART  FULGURATION N/A 06/10/2019  ? Procedure: FULGURATION ANAL WART;  Surgeon: Benjamine Sprague, DO;  Location: [facility] ORS;  Service: General;  Laterality: N/A;  ?  ?Medications Prior to Admission  ?Medication Sig Dispense Refill Last Dose  ? lisinopril-hydrochlorothiazide (ZESTORETIC) 20-12.5 MG tablet Take 1 tablet by mouth daily.   10/25/2021 at Unknown  ? tamsulosin (FLOMAX) 0.4 MG CAPS capsule Take 0.4 mg by mouth daily.   10/25/2021  ? ?Social History  ? ?Socioeconomic History  ? Marital status: Divorced  ?  Spouse name: Not on file  ? Number of children: Not on file  ? Years of education: Not on file  ? Highest education level: Not on file  ?Occupational History  ? Not on file  ?Tobacco Use  ? Smoking status: Never  ? Smokeless tobacco: Never  ?Vaping Use  ? Vaping Use: Never used  ?Substance and Sexual Activity  ? Alcohol use: No  ? Drug use: No  ? Sexual activity: Not on file  ?Other Topics Concern  ? Not on file  ?Social History Narrative  ? Not on file  ? ?Social Determinants of Health  ? ?Financial Resource Strain: Not on file  ?Food Insecurity: Not on file  ?Transportation Needs: Not on file  ?Physical Activity: Not on file  ?Stress: Not on file  ?Social Connections: Not on file  ?Intimate Partner Violence: Not on file  ?  ?Family History  ?Problem Relation Age of Onset  ? Hypertension Father   ? ? ?PHYSICAL EXAM ? ?General: Elderly Caucasian male. Well developed, well nourished, in no acute distress. ?HEENT:  Normocephalic and atraumatic. Extremely hard of  hearing.  ?Neck:  No JVD.  ?Lungs: Clear bilaterally to auscultation . ?Heart: HRRR . Normal S1 and S2 without gallops or murmurs.  ?Abdomen: non distended appearing. ?Msk:  Normal strength and tone for age. ?Extremities: No obvious clubbing, cyanosis or edema ?Neuro: Alert and oriented X 3. ?Psych:  Good affect, responds appropriately ? ?Labs: ?  ?Lab Results  ?Component Value Date  ? WBC 5.9 10/31/2021  ? HGB 10.5 (L) 10/31/2021  ? HCT 32.5 (L) 10/31/2021  ? MCV  87.4 10/31/2021  ? PLT 197 10/31/2021  ?  ?Recent Labs  ?Lab 10/31/21 ?1884  ?NA 141  ?K 4.2  ?CL 111  ?CO2 24  ?BUN 31*  ?CREATININE 1.58*  ?CALCIUM 8.7*  ?GLUCOSE 100*  ? ? ?Lab Results  ?Component Value Date  ? TROPONINI <0.03 04/22/2016  ? ?  ?Lab Results  ?Component Value Date  ? CHOL 208 (H) 10/27/2021  ? ?Lab Results  ?Component Value Date  ? HDL 71 10/27/2021  ? ?Lab Results  ?Component Value Date  ? LDLCALC 124 (H) 10/27/2021  ? ?Lab Results  ?Component Value Date  ? TRIG 63 10/27/2021  ? ?Lab Results  ?Component Value Date  ? CHOLHDL 2.9 10/27/2021  ? ?No results found for: LDLDIRECT  ?  ?Radiology: CARDIAC CATHETERIZATION ? ?Result Date: 10/30/2021 ?  Prox RCA lesion is 75% stenosed.   Mid RCA lesion is 95% stenosed.   Prox RCA to Mid RCA lesion is 60% stenosed.   Prox LAD lesion is 95% stenosed with 95% stenosed side branch in 1st Sept.   LV end diastolic pressure is normal. 81 year old male with hypertension hyperlipidemia with progressive episodes of chest discomfort consistent with unstable angina with an abnormal stress test consistent with anterior myocardial ischemia Echocardiogram showing normal LV systolic function with ejection fraction of 60% and no evidence of significant valvular heart disease Left anterior descending artery having a 95% proximal lesion and a large diagonal branch Right coronary artery with multiple stenoses at 75% proximal and 95% mid Plan Further consultation with cardiovascular surgery for coronary bypass grafting due to tortuosity of his blood vessels and a significant bifurcating stenoses High intensity cholesterol therapy Hypertension control Cardiac rehabilitation  ? ?NM Myocar Multi W/Spect W/Wall Motion / EF ? ?Result Date: 10/30/2021 ?  Findings are consistent with ischemia. The study is high risk.   No ST deviation was noted.   LV perfusion is abnormal. There is evidence of ischemia. Defect 1: There is a medium defect with moderate reduction in uptake present in the  apical to mid anterior, anterolateral and anteroseptal location(s) that is reversible. There is normal wall motion in the defect area. Consistent with ischemia.   Left ventricular function is normal. End diastolic cavity size is normal.   Prior study not available for comparison.  ? ?DG Chest Port 1 View ? ?Result Date: 10/26/2021 ?CLINICAL DATA:  Chest pain EXAM: PORTABLE CHEST 1 VIEW COMPARISON:  04/21/2016 FINDINGS: No focal opacity, pleural effusion, or pneumothorax. Normal cardiomediastinal silhouette. Mild central vascular congestion. Borderline cardiac size. Retrocardiac oval opacity potentially due to hiatal hernia. IMPRESSION: 1. Borderline cardiomegaly with slight central congestion 2. Ovoid retrocardiac opacity, suspect hiatal hernia, though could correlate with lateral view of the chest. Electronically Signed   By: Donavan Foil M.D.   On: 10/26/2021 17:47  ? ?ECHOCARDIOGRAM COMPLETE ? ?Result Date: 10/28/2021 ?   ECHOCARDIOGRAM REPORT   Patient Name:   JUNAH YAM Date of Exam: 10/28/2021 Medical Rec #:  [id number]       Height:       65.0 in Accession #:    [id number]      Weight:       154.3 lb Date of Birth:  1940-11-20       BSA:          1.772 m? Patient Age:    67 years        BP:           129/69 mmHg Patient Gender: M               HR:           49 bpm. Exam Location:  [facility] Procedure: 2D Echo Indications:     Chest Pain R07.9                  Elevated Troponin  History:         Patient has no prior history of Echocardiogram examinations.  Sonographer:     Kathlen Brunswick RDCS Referring Phys:  [id number] AMY N COX Diagnosing Phys: Donnelly Angelica IMPRESSIONS  1. Left ventricular ejection fraction, by estimation, is 60 to 65%. The left ventricle has normal function. The left ventricle has no regional wall motion abnormalities. Left ventricular diastolic parameters were normal.  2. Right ventricular systolic function is normal. The right ventricular size is normal.  3. The mitral valve is normal in  structure. Mild mitral valve regurgitation. No evidence of mitral stenosis.  4. The aortic valve is normal in structure. Aortic valve regurgitation is trivial. No aortic stenosis is present.  5. The inferior vena cava

## 2021-10-31 NOTE — Plan of Care (Signed)

## 2021-10-31 NOTE — Consult Note (Signed)
ANTICOAGULATION CONSULT NOTE  ? ?Pharmacy Consult for Heparin  ?Indication: chest pain/ACS ? ?Allergies  ?Allergen Reactions  ? Sulfa Antibiotics Other (See Comments)  ?  Unknown reaction  ? ? ?Patient Measurements: ?  ?Heparin Dosing Weight: 70 kg ? ?Vital Signs: ?Temp: 98 ?F (36.7 ?C) (03/15 1458) ?BP: 140/71 (03/15 1458) ?Pulse Rate: 67 (03/15 1458) ? ?Labs: ?Recent Labs  ?  10/29/21 ?2836 10/30/21 ?6294 10/31/21 ?0107 10/31/21 ?7654 10/31/21 ?0910  ?HGB 10.2* 12.4*  --  10.5*  --   ?HCT 32.0* 38.3*  --  32.5*  --   ?PLT 205 251  --  197  --   ?HEPARINUNFRC  --   --  0.31  --  0.34  ?CREATININE 1.66* 1.66*  --  1.58*  --   ? ? ?Estimated Creatinine Clearance: 32.4 mL/min (A) (by C-G formula based on SCr of 1.58 mg/dL (H)). ? ? ?Medical History: ?Past Medical History:  ?Diagnosis Date  ? Arthritis   ? Cataracts, both eyes   ? 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 History of kidney stones   ? Hypertension   ? ? ?Medications:  ?Medications Prior to Admission  ?Medication Sig Dispense Refill Last Dose  ? [START ON 11/01/2021] amLODipine (NORVASC) 5 MG tablet Take 1 tablet (5 mg total) by mouth daily. 30 tablet 1   ? [START ON 11/01/2021] aspirin EC 81 MG EC tablet Take 1 tablet (81 mg total) by mouth daily. Swallow whole. 30 tablet 11   ? [START ON 11/01/2021] atorvastatin (LIPITOR) 80 MG tablet Take 1 tablet (80 mg total) by mouth daily. 30 tablet 1   ? heparin 25000 UT/250ML infusion Continue heparin is being transferred to [facility] for CABG 250 mL 0   ? No anticoagulants documented in medical history from PCP or from pharmacy refill records. ? ?Assessment: ?81yrold male with HTN, CKD3a, HLD presents to ED for evaluation of severe chest pain as result of heavy exertion. Stress test was positive, now S/p cardiac catheterization showing critical bifurcating left anterior descending artery stenosis at large diagonal and multiple significant stenoses on a tortuous right coronary artery at up to 95%. Cardiology planning for  CABG. No anticoagulation prior to admission. Pharmacy consulted for heparin.   ? ?Heparin level 0.34 is at low end of therapeutic on 1000 units/hr. Will increase rate slightly to keep in goal.   ? ? ?Goal of Therapy:  ?Heparin level 0.3-0.7 units/ml ?Monitor platelets by anticoagulation protocol: Yes ? ?Plan:  ?Increase heparin to 1050 units/hr ?Monitor daily heparin level, CBC ?Monitor for signs/symptoms of bleeding  ?F/u TCTS plan  ? ?LBenetta Spar PharmD, BCPS, BCCP ?Clinical Pharmacist ? ?Please check AMION for all MKatiephone numbers ?After 10:00 PM, call [phone]?  ? ? ? ? ? ? ? ?

## 2021-10-31 NOTE — Progress Notes (Signed)
Assisted with admit orders, written per d/w Dr. Burt Knack. Continuing meds given at [facility] this AM - ASA, atorvastatin, amlodipine, tamsulosin, ferrous gluconate, heparin per pharmacy. No BB given baseline bradycardia. ?

## 2021-11-01 ENCOUNTER — Other Ambulatory Visit (HOSPITAL_COMMUNITY): Payer: Medicare Other

## 2021-11-01 ENCOUNTER — Inpatient Hospital Stay (HOSPITAL_COMMUNITY)
Admission: AD | Disposition: A | Discharge status: Home / Self Care | Payer: Self-pay | Source: Other Acute Inpatient Hospital | Attending: Cardiovascular Disease

## 2021-11-01 ENCOUNTER — Encounter (HOSPITAL_COMMUNITY): Payer: Self-pay | Admitting: Cardiovascular Disease

## 2021-11-01 DIAGNOSIS — I2510 Atherosclerotic heart disease of native coronary artery without angina pectoris: Secondary | ICD-10-CM

## 2021-11-01 LAB — COMPREHENSIVE METABOLIC PANEL
ALT: 44 U/L (ref 0–44)
AST: 31 U/L (ref 15–41)
Albumin: 3.3 g/dL — ABNORMAL LOW (ref 3.5–5.0)
Alkaline Phosphatase: 33 U/L — ABNORMAL LOW (ref 38–126)
Anion gap: 8 (ref 5–15)
BUN: 32 mg/dL — ABNORMAL HIGH (ref 8–23)
CO2: 24 mmol/L (ref 22–32)
Calcium: 8.7 mg/dL — ABNORMAL LOW (ref 8.9–10.3)
Chloride: 106 mmol/L (ref 98–111)
Creatinine, Ser: 1.61 mg/dL — ABNORMAL HIGH (ref 0.61–1.24)
GFR, Estimated: 43 mL/min — ABNORMAL LOW (ref >60)
Glucose, Bld: 97 mg/dL (ref 70–99)
Potassium: 4.2 mmol/L (ref 3.5–5.1)
Sodium: 138 mmol/L (ref 135–145)
Total Bilirubin: 0.4 mg/dL (ref 0.3–1.2)
Total Protein: 5.7 g/dL — ABNORMAL LOW (ref 6.5–8.1)

## 2021-11-01 LAB — CBC
HCT: 29.6 % — ABNORMAL LOW (ref 39.0–52.0)
Hemoglobin: 9.5 g/dL — ABNORMAL LOW (ref 13.0–17.0)
MCH: 28.4 pg (ref 26.0–34.0)
MCHC: 32.1 g/dL (ref 30.0–36.0)
MCV: 88.4 fL (ref 80.0–100.0)
Platelets: 193 10*3/uL (ref 150–400)
RBC: 3.35 MIL/uL — ABNORMAL LOW (ref 4.22–5.81)
RDW: 14 % (ref 11.5–15.5)
WBC: 5.9 10*3/uL (ref 4.0–10.5)
nRBC: 0 % (ref 0.0–0.2)

## 2021-11-01 LAB — HEPARIN LEVEL (UNFRACTIONATED): Heparin Unfractionated: 0.28 IU/mL — ABNORMAL LOW (ref 0.30–0.70)

## 2021-11-01 LAB — POCT ACTIVATED CLOTTING TIME
Activated Clotting Time: 245 seconds
Activated Clotting Time: 317 seconds
Activated Clotting Time: 311 seconds

## 2021-11-01 SURGERY — CORONARY STENT INTERVENTION
Anesthesia: LOCAL

## 2021-11-01 MED ORDER — NITROGLYCERIN 1 MG/10 ML FOR IR/CATH LAB
INTRA_ARTERIAL | Status: AC
  Filled 2021-11-01: qty 10

## 2021-11-01 MED ORDER — HEPARIN SODIUM (PORCINE) 1000 UNIT/ML IJ SOLN
INTRAMUSCULAR | Status: AC
  Filled 2021-11-01: qty 10

## 2021-11-01 MED ORDER — LIDOCAINE HCL (PF) 1 % IJ SOLN
INTRAMUSCULAR | Status: DC | PRN
Start: 2021-11-01 — End: 2021-11-01
  Administered 2021-11-01: 2 mL via INTRADERMAL

## 2021-11-01 MED ORDER — CLOPIDOGREL BISULFATE 75 MG PO TABS
75.0000 mg | ORAL_TABLET | Freq: Every day | ORAL | Status: DC
  Administered 2021-11-02: 75 mg via ORAL
  Filled 2021-11-01: qty 1

## 2021-11-01 MED ORDER — MIDAZOLAM HCL 2 MG/2ML IJ SOLN
INTRAMUSCULAR | Status: AC
  Filled 2021-11-01: qty 2

## 2021-11-01 MED ORDER — VERAPAMIL HCL 2.5 MG/ML IV SOLN
INTRAVENOUS | Status: DC | PRN
  Administered 2021-11-01: 10 mL via INTRA_ARTERIAL

## 2021-11-01 MED ORDER — SODIUM CHLORIDE 0.9 % IV SOLN: INTRAVENOUS | Status: AC

## 2021-11-01 MED ORDER — FENTANYL CITRATE (PF) 100 MCG/2ML IJ SOLN
INTRAMUSCULAR | Status: DC | PRN
  Administered 2021-11-01 (×2): 25 ug via INTRAVENOUS

## 2021-11-01 MED ORDER — VERAPAMIL HCL 2.5 MG/ML IV SOLN
INTRAVENOUS | Status: AC
  Filled 2021-11-01: qty 2

## 2021-11-01 MED ORDER — SODIUM CHLORIDE 0.9 % IV SOLN: 250.0000 mL | INTRAVENOUS | Status: DC | PRN

## 2021-11-01 MED ORDER — HEPARIN (PORCINE) IN NACL 1000-0.9 UT/500ML-% IV SOLN
INTRAVENOUS | Status: DC | PRN
Start: 2021-11-01 — End: 2021-11-01
  Administered 2021-11-01 (×2): 500 mL

## 2021-11-01 MED ORDER — LIDOCAINE HCL (PF) 1 % IJ SOLN
INTRAMUSCULAR | Status: AC
  Filled 2021-11-01: qty 30

## 2021-11-01 MED ORDER — IOHEXOL 350 MG/ML SOLN
INTRAVENOUS | Status: DC | PRN
  Administered 2021-11-01: 145 mL

## 2021-11-01 MED ORDER — SODIUM CHLORIDE 0.9% FLUSH
3.0000 mL | Freq: Two times a day (BID) | INTRAVENOUS | Status: DC
  Administered 2021-11-01: 3 mL via INTRAVENOUS

## 2021-11-01 MED ORDER — SODIUM CHLORIDE 0.9% FLUSH: 3.0000 mL | INTRAVENOUS | Status: DC | PRN

## 2021-11-01 MED ORDER — SODIUM CHLORIDE 0.9% FLUSH
3.0000 mL | Freq: Two times a day (BID) | INTRAVENOUS | Status: DC
  Administered 2021-11-02: 3 mL via INTRAVENOUS

## 2021-11-01 MED ORDER — CLOPIDOGREL BISULFATE 75 MG PO TABS
600.0000 mg | ORAL_TABLET | Freq: Once | ORAL | Status: AC
  Administered 2021-11-01: 600 mg via ORAL
  Filled 2021-11-01: qty 8

## 2021-11-01 MED ORDER — SODIUM CHLORIDE 0.9 % WEIGHT BASED INFUSION
3.0000 mL/kg/h | INTRAVENOUS | Status: DC
  Administered 2021-11-01: 3 mL/kg/h via INTRAVENOUS

## 2021-11-01 MED ORDER — LABETALOL HCL 5 MG/ML IV SOLN: 10.0000 mg | INTRAVENOUS | Status: AC | PRN

## 2021-11-01 MED ORDER — HEPARIN SODIUM (PORCINE) 1000 UNIT/ML IJ SOLN
INTRAMUSCULAR | Status: AC
Start: 2021-11-01 — End: ?
  Filled 2021-11-01: qty 10

## 2021-11-01 MED ORDER — HEPARIN (PORCINE) IN NACL 1000-0.9 UT/500ML-% IV SOLN
INTRAVENOUS | Status: AC
  Filled 2021-11-01: qty 1000

## 2021-11-01 MED ORDER — HEPARIN SODIUM (PORCINE) 1000 UNIT/ML IJ SOLN
INTRAMUSCULAR | Status: DC | PRN
  Administered 2021-11-01: 8000 [IU] via INTRAVENOUS
  Administered 2021-11-01: 4000 [IU] via INTRAVENOUS

## 2021-11-01 MED ORDER — FENTANYL CITRATE (PF) 100 MCG/2ML IJ SOLN
INTRAMUSCULAR | Status: AC
  Filled 2021-11-01: qty 2

## 2021-11-01 MED ORDER — HYDRALAZINE HCL 20 MG/ML IJ SOLN: 10.0000 mg | INTRAMUSCULAR | Status: AC | PRN

## 2021-11-01 MED ORDER — SODIUM CHLORIDE 0.9 % WEIGHT BASED INFUSION: 1.0000 mL/kg/h | INTRAVENOUS | Status: DC

## 2021-11-01 MED ORDER — MIDAZOLAM HCL 2 MG/2ML IJ SOLN
INTRAMUSCULAR | Status: DC | PRN
  Administered 2021-11-01 (×2): 1 mg via INTRAVENOUS

## 2021-11-01 SURGICAL SUPPLY — 22 items
BALLN SAPPHIRE 2.0X12 (BALLOONS) ×2
BALLN SAPPHIRE ~~LOC~~ 3.0X8 (BALLOONS) ×1 IMPLANT
BALLN SAPPHIRE ~~LOC~~ 3.5X12 (BALLOONS) ×1 IMPLANT
BALLN ~~LOC~~ EUPHORA RX 3.75X8 (BALLOONS) ×2
BALLOON SAPPHIRE 2.0X12 (BALLOONS) IMPLANT
BALLOON ~~LOC~~ EUPHORA RX 3.75X8 (BALLOONS) IMPLANT
CATH LAUNCHER 6FR AL.75 (CATHETERS) ×1 IMPLANT
CATH VISTA GUIDE 6FR XBLAD3.5 (CATHETERS) ×1 IMPLANT
DEVICE RAD COMP TR BAND LRG (VASCULAR PRODUCTS) ×1 IMPLANT
GLIDESHEATH SLEND SS 6F .021 (SHEATH) ×1 IMPLANT
GUIDEWIRE INQWIRE 1.5J.035X260 (WIRE) IMPLANT
INQWIRE 1.5J .035X260CM (WIRE) ×2
KIT ENCORE 26 ADVANTAGE (KITS) ×1 IMPLANT
KIT HEART LEFT (KITS) ×2 IMPLANT
PACK CARDIAC CATHETERIZATION (CUSTOM PROCEDURE TRAY) ×2 IMPLANT
STENT ONYX FRONTIER 2.75X12 (Permanent Stent) ×1 IMPLANT
STENT ONYX FRONTIER 3.0X18 (Permanent Stent) ×1 IMPLANT
STENT ONYX FRONTIER 3.5X12 (Permanent Stent) ×1 IMPLANT
TRANSDUCER W/STOPCOCK (MISCELLANEOUS) ×2 IMPLANT
TUBING CIL FLEX 10 FLL-RA (TUBING) ×2 IMPLANT
WIRE COUGAR XT STRL 190CM (WIRE) ×2 IMPLANT
WIRE HI TORQ WHISPER MS 190CM (WIRE) ×2 IMPLANT

## 2021-11-01 NOTE — Interval H&P Note (Signed)
History and Physical Interval Note: ? ?11/01/2021 ?10:31 AM ? ?Juan [name]  has presented today for surgery, with the diagnosis of cad.  The various methods of treatment have been discussed with the patient and family. After consideration of risks, benefits and other options for treatment, the patient has consented to  Procedure(s): ?CORONARY STENT INTERVENTION (N/A) as a surgical intervention.  The patient's history has been reviewed, patient examined, no change in status, stable for surgery.  I have reviewed the patient's chart and labs.  Questions were answered to the patient's satisfaction.   ? ?Cath Lab Visit (complete for each Cath Lab visit) ? ?Clinical Evaluation Leading to the Procedure:  ? ?ACS: Yes.   ? ?Non-ACS:   ? ?Anginal Classification: CCS III ? ?Anti-ischemic medical therapy: Minimal Therapy (1 class of medications) ? ?Non-Invasive Test Results: No non-invasive testing performed ? ?Prior CABG: No previous CABG ? ? ? ? ? ? ? ? ?Lauree Chandler ? ? ?

## 2021-11-01 NOTE — Progress Notes (Addendum)
? ?Progress Note ? ?Patient Name: Juan [name] ?Date of Encounter: 11/01/2021 ? ?[facility] Cardiologist: None  ? ?Subjective  ? ?No chest pain this morning. Resting in bed.  ? ?Inpatient Medications  ?  ?Scheduled Meds: ? amLODipine  5 mg Oral Daily  ? aspirin EC  81 mg Oral Daily  ? atorvastatin  80 mg Oral Daily  ? ferrous gluconate  324 mg Oral Q breakfast  ? sodium chloride flush  3 mL Intravenous Q12H  ? tamsulosin  0.4 mg Oral Daily  ? ?Continuous Infusions: ? sodium chloride    ? heparin 1,050 Units/hr (10/31/21 1916)  ? ?PRN Meds: ?sodium chloride, acetaminophen, nitroGLYCERIN, ondansetron (ZOFRAN) IV, sodium chloride flush  ? ?Vital Signs  ?  ?Vitals:  ? 11/01/21 0540  ?BP: 110/60  ?Pulse: 73  ?Resp: 18  ?Temp: 97.6 ?F (36.4 ?C)  ?TempSrc: Oral  ?SpO2: 98%  ?Weight: 63.4 kg  ? ?No intake or output data in the 24 hours ending 11/01/21 0652 ?Last 3 Weights 11/01/2021 10/30/2021 10/30/2021  ?Weight (lbs) 139 lb 11.2 oz 139 lb 9.6 oz 139 lb 9.6 oz  ?Weight (kg) 63.368 kg 63.322 kg 63.322 kg  ?   ? ?Telemetry  ?  ?SB - Personally Reviewed ? ?ECG  ?  ?No new tracing ? ?Physical Exam  ? ?GEN: No acute distress.   ?Neck: No JVD ?Cardiac: RRR, no murmurs, rubs, or gallops.  ?Respiratory: Clear to auscultation bilaterally. ?GI: Soft, nontender, non-distended  ?MS: No edema; No deformity. Right radial cath site stable.  ?Neuro:  Nonfocal  ?Psych: Normal affect  ? ?Labs  ?  ?High Sensitivity Troponin:   ?Recent Labs  ?Lab 10/26/21 ?1707 10/26/21 ?1840 10/27/21 ?0902  ?TROPONINIHS 25* 58* 32*  ?   ?Chemistry ?Recent Labs  ?Lab 10/30/21 ?0093 10/31/21 ?8182 11/01/21 ?0322  ?NA 137 141 138  ?K 4.2 4.2 4.2  ?CL 105 111 106  ?CO2 '23 24 24  '$ ?GLUCOSE 122* 100* 97  ?BUN 33* 31* 32*  ?CREATININE 1.66* 1.58* 1.61*  ?CALCIUM 9.5 8.7* 8.7*  ?PROT  --   --  5.7*  ?ALBUMIN  --   --  3.3*  ?AST  --   --  31  ?ALT  --   --  44  ?ALKPHOS  --   --  33*  ?BILITOT  --   --  0.4  ?GFRNONAA 41* 44* 43*  ?ANIONGAP '9 6 8  '$ ?  ?Lipids   ?Recent Labs  ?Lab 10/27/21 ?9937  ?CHOL 208*  ?TRIG 63  ?HDL 71  ?[place] 124*  ?CHOLHDL 2.9  ?  ?Hematology ?Recent Labs  ?Lab 10/30/21 ?1696 10/31/21 ?7893 11/01/21 ?0322  ?WBC 7.1 5.9 5.9  ?RBC 4.42 3.72* 3.35*  ?HGB 12.4* 10.5* 9.5*  ?HCT 38.3* 32.5* 29.6*  ?MCV 86.7 87.4 88.4  ?MCH 28.1 28.2 28.4  ?MCHC 32.4 32.3 32.1  ?RDW 13.6 13.8 14.0  ?PLT 251 197 193  ? ?Thyroid No results for input(s): TSH, FREET4 in the last 168 hours.  ?BNP ?Recent Labs  ?Lab 10/26/21 ?1720  ?BNP 129.0*  ?  ?DDimer No results for input(s): DDIMER in the last 168 hours.  ? ?Radiology  ?  ?CARDIAC CATHETERIZATION ? ?Result Date: 10/30/2021 ?  Prox RCA lesion is 75% stenosed.   Mid RCA lesion is 95% stenosed.   Prox RCA to Mid RCA lesion is 60% stenosed.   Prox LAD lesion is 95% stenosed with 95% stenosed side branch in 1st Sept.  LV end diastolic pressure is normal. 81 year old male with hypertension hyperlipidemia with progressive episodes of chest discomfort consistent with unstable angina with an abnormal stress test consistent with anterior myocardial ischemia Echocardiogram showing normal LV systolic function with ejection fraction of 60% and no evidence of significant valvular heart disease Left anterior descending artery having a 95% proximal lesion and a large diagonal branch Right coronary artery with multiple stenoses at 75% proximal and 95% mid Plan Further consultation with cardiovascular surgery for coronary bypass grafting due to tortuosity of his blood vessels and a significant bifurcating stenoses High intensity cholesterol therapy Hypertension control Cardiac rehabilitation   ? ?Cardiac Studies  ? ?Cath: 10/30/21 ?  ?  Prox RCA lesion is 75% stenosed. ?  Mid RCA lesion is 95% stenosed. ?  Prox RCA to Mid RCA lesion is 60% stenosed. ?  Prox LAD lesion is 95% stenosed with 95% stenosed side branch in 1st Sept. ?  LV end diastolic pressure is normal. ?  ?81 year old male with hypertension hyperlipidemia with progressive  episodes of chest discomfort consistent with unstable angina with an abnormal stress test consistent with anterior myocardial ischemia ?  ?Echocardiogram showing normal LV systolic function with ejection fraction of 60% and no evidence of significant valvular heart disease ?  ?Left anterior descending artery having a 95% proximal lesion and a large diagonal branch ?Right coronary artery with multiple stenoses at 75% proximal and 95% mid ?  ?Plan ?Further consultation with cardiovascular surgery for coronary bypass grafting due to tortuosity of his blood vessels and a significant bifurcating stenoses ?High intensity cholesterol therapy ?Hypertension control ?Cardiac rehabilitation ?  ?Diagnostic ?Dominance: Right ?Echo: 10/26/2021 ?  ?IMPRESSIONS  ? ? ? 1. Left ventricular ejection fraction, by estimation, is 60 to 65%. The  ?left ventricle has normal function. The left ventricle has no regional  ?wall motion abnormalities. Left ventricular diastolic parameters were  ?normal.  ? 2. Right ventricular systolic function is normal. The right ventricular  ?size is normal.  ? 3. The mitral valve is normal in structure. Mild mitral valve  ?regurgitation. No evidence of mitral stenosis.  ? 4. The aortic valve is normal in structure. Aortic valve regurgitation is  ?trivial. No aortic stenosis is present.  ? 5. The inferior vena cava is normal in size with greater than 50%  ?respiratory variability, suggesting right atrial pressure of 3 mmHg.  ? ?FINDINGS  ? Left Ventricle: Left ventricular ejection fraction, by estimation, is 60  ?to 65%. The left ventricle has normal function. The left ventricle has no  ?regional wall motion abnormalities. The left ventricular internal cavity  ?size was normal in size. There is  ? no left ventricular hypertrophy. Left ventricular diastolic parameters  ?were normal.  ? ?Right Ventricle: The right ventricular size is normal. No increase in  ?right ventricular wall thickness. Right ventricular  systolic function is  ?normal.  ? ?Left Atrium: Left atrial size was normal in size.  ? ?Right Atrium: Right atrial size was normal in size.  ? ?Pericardium: There is no evidence of pericardial effusion.  ? ?Mitral Valve: The mitral valve is normal in structure. Mild mitral valve  ?regurgitation. No evidence of mitral valve stenosis.  ? ?Tricuspid Valve: The tricuspid valve is normal in structure. Tricuspid  ?valve regurgitation is trivial. No evidence of tricuspid stenosis.  ? ?Aortic Valve: The aortic valve is normal in structure. Aortic valve  ?regurgitation is trivial. No aortic stenosis is present. Aortic valve peak  ?gradient measures 7.5 mmHg.  ? ?  Pulmonic Valve: The pulmonic valve was not well visualized. Pulmonic valve  ?regurgitation is mild. No evidence of pulmonic stenosis.  ? ?Aorta: The aortic root is normal in size and structure.  ? ?Venous: The inferior vena cava is normal in size with greater than 50%  ?respiratory variability, suggesting right atrial pressure of 3 mmHg.  ? ?IAS/Shunts: No atrial level shunt detected by color flow Doppler.  ? ?Patient Profile  ?   ?81 y.o. 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 ? ?Assessment & Plan  ?  ?Elevated troponin/Unstable Angina: presented to [facility] with chest pain and found to have mildly elevated troponin. 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 He was transferred to [facility] for evaluation with TCTS but anatomy felt to be favorable for PCI and patient prefers to avoid surgery. Discussed with MD this morning, cath films were reviewed and lesions felt amenable to PCI.  ?-- NPO ?-- plavix '600mg'$  x1 now ?-- continue statin ? ?Shared Decision Making/Informed Consent ?The risks [stroke (1 in 1000), death (1 in 63), kidney failure [usually temporary] (1 in 500), bleeding (1 in 200), allergic reaction [possibly  serious] (1 in 200)], benefits (diagnostic support and management of coronary artery disease) and alternatives of a cardiac catheterization were discussed in detail with Mr. Busbee and he is willing to proceed.

## 2021-11-01 NOTE — Plan of Care (Signed)
  Problem: Activity: Goal: Risk for activity intolerance will decrease Outcome: Progressing   

## 2021-11-01 NOTE — Consult Note (Signed)
ANTICOAGULATION CONSULT NOTE  ? ?Pharmacy Consult for Heparin  ?Indication: chest pain/ACS ? ?Allergies  ?Allergen Reactions  ? Sulfa Antibiotics Other (See Comments)  ?  Unknown reaction  ? ? ?Patient Measurements: ?Weight: 63.4 kg (139 lb 11.2 oz) ?Heparin Dosing Weight: 70 kg ? ?Vital Signs: ?Temp: 97.6 ?F (36.4 ?C) (03/16 0540) ?Temp Source: Oral (03/16 0540) ?BP: 110/60 (03/16 0540) ?Pulse Rate: 73 (03/16 0540) ? ?Labs: ?Recent Labs  ?  10/30/21 ?4128 10/31/21 ?0107 10/31/21 ?7867 10/31/21 ?6720 11/01/21 ?0322  ?HGB 12.4*  --  10.5*  --  9.5*  ?HCT 38.3*  --  32.5*  --  29.6*  ?PLT 251  --  197  --  193  ?HEPARINUNFRC  --  0.31  --  0.34 0.28*  ?CREATININE 1.66*  --  1.58*  --  1.61*  ? ? ? ?Estimated Creatinine Clearance: 31.8 mL/min (A) (by C-G formula based on SCr of 1.61 mg/dL (H)). ? ? ?Medical History: ?Past Medical History:  ?Diagnosis Date  ? Arthritis   ? Cataracts, both eyes   ? 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 History of kidney stones   ? Hypertension   ? ? ?Medications:  ?Medications Prior to Admission  ?Medication Sig Dispense Refill Last Dose  ? amLODipine (NORVASC) 5 MG tablet Take 1 tablet (5 mg total) by mouth daily. 30 tablet 1   ? aspirin EC 81 MG EC tablet Take 1 tablet (81 mg total) by mouth daily. Swallow whole. 30 tablet 11   ? atorvastatin (LIPITOR) 80 MG tablet Take 1 tablet (80 mg total) by mouth daily. 30 tablet 1   ? heparin 25000 UT/250ML infusion Continue heparin is being transferred to [facility] for CABG 250 mL 0   ? No anticoagulants documented in medical history from PCP or from pharmacy refill records. ? ?Assessment: ?81yrold male with HTN, CKD3a, HLD presents to ED for evaluation of severe chest pain as result of heavy exertion. Stress test was positive, now S/p cardiac catheterization showing critical bifurcating left anterior descending artery stenosis at large diagonal and multiple significant stenoses on a tortuous right coronary artery at up to 95%. Cardiology planning for  CABG. No anticoagulation prior to admission. Pharmacy consulted for heparin.   ? ?Heparin level is subtherapeutic at 0.28, on 1050 units/hr. Hgb 9.5, plt 193. No s/sx of bleeding.  ? ? ?Goal of Therapy:  ?Heparin level 0.3-0.7 units/ml ?Monitor platelets by anticoagulation protocol: Yes ? ?Plan:  ?Increase heparin to 1150 units/hr ?Monitor daily heparin level, CBC ?Monitor for signs/symptoms of bleeding  ?F/u plan for cath 3/16 ? ?KAntonietta Jewel PharmD, BCCCP ?Clinical Pharmacist  ?Phone: [phone]?11/01/2021 7:40 AM ? ?Please check AMION for all [place] numbers ?After 10:00 PM, call [phone]? ?  ? ? ? ? ? ? ? ?

## 2021-11-02 ENCOUNTER — Other Ambulatory Visit (HOSPITAL_COMMUNITY): Payer: Self-pay

## 2021-11-02 LAB — BASIC METABOLIC PANEL
Anion gap: 9 (ref 5–15)
BUN: 22 mg/dL (ref 8–23)
CO2: 22 mmol/L (ref 22–32)
Calcium: 8.7 mg/dL — ABNORMAL LOW (ref 8.9–10.3)
Chloride: 106 mmol/L (ref 98–111)
Creatinine, Ser: 1.55 mg/dL — ABNORMAL HIGH (ref 0.61–1.24)
GFR, Estimated: 45 mL/min — ABNORMAL LOW (ref >60)
Glucose, Bld: 93 mg/dL (ref 70–99)
Potassium: 4 mmol/L (ref 3.5–5.1)
Sodium: 137 mmol/L (ref 135–145)

## 2021-11-02 LAB — CBC
HCT: 28.8 % — ABNORMAL LOW (ref 39.0–52.0)
Hemoglobin: 9.5 g/dL — ABNORMAL LOW (ref 13.0–17.0)
MCH: 28.7 pg (ref 26.0–34.0)
MCHC: 33 g/dL (ref 30.0–36.0)
MCV: 87 fL (ref 80.0–100.0)
Platelets: 186 10*3/uL (ref 150–400)
RBC: 3.31 MIL/uL — ABNORMAL LOW (ref 4.22–5.81)
RDW: 14.3 % (ref 11.5–15.5)
WBC: 5.9 10*3/uL (ref 4.0–10.5)
nRBC: 0 % (ref 0.0–0.2)

## 2021-11-02 MED ORDER — NITROGLYCERIN 0.4 MG SL SUBL
0.4000 mg | SUBLINGUAL_TABLET | SUBLINGUAL | 1 refills | Status: AC | PRN
  Filled 2021-11-02: qty 25, 7d supply, fill #0

## 2021-11-02 MED ORDER — ASPIRIN 81 MG PO TBEC
81.0000 mg | DELAYED_RELEASE_TABLET | Freq: Every day | ORAL | 2 refills | Status: DC
  Filled 2021-11-02: qty 90, 90d supply, fill #0

## 2021-11-02 MED ORDER — CLOPIDOGREL BISULFATE 75 MG PO TABS
75.0000 mg | ORAL_TABLET | Freq: Every day | ORAL | 2 refills | Status: DC
  Filled 2021-11-02: qty 90, 90d supply, fill #0

## 2021-11-02 MED ORDER — FERROUS GLUCONATE 324 (38 FE) MG PO TABS
324.0000 mg | ORAL_TABLET | Freq: Every day | ORAL | 1 refills | Status: DC
  Filled 2021-11-02: qty 30, 30d supply, fill #0

## 2021-11-02 MED ORDER — ATORVASTATIN CALCIUM 80 MG PO TABS
80.0000 mg | ORAL_TABLET | Freq: Every day | ORAL | 0 refills | Status: DC
  Filled 2021-11-02: qty 90, 90d supply, fill #0

## 2021-11-02 MED ORDER — IRON SUCROSE 20 MG/ML IV SOLN
200.0000 mg | Freq: Every day | INTRAVENOUS | Status: DC
  Filled 2021-11-02: qty 10

## 2021-11-02 MED ORDER — AMLODIPINE BESYLATE 5 MG PO TABS
5.0000 mg | ORAL_TABLET | Freq: Every day | ORAL | 1 refills | Status: DC
  Filled 2021-11-02: qty 30, 30d supply, fill #0

## 2021-11-02 MED ORDER — FERUMOXYTOL INJECTION 510 MG/17 ML
510.0000 mg | INTRAVENOUS | Status: AC
  Administered 2021-11-02: 510 mg via INTRAVENOUS
  Filled 2021-11-02: qty 17

## 2021-11-02 MED FILL — Nitroglycerin IV Soln 100 MCG/ML in D5W: INTRA_ARTERIAL | Qty: 10 | Status: AC

## 2021-11-02 NOTE — Discharge Summary (Addendum)
?Discharge Summary  ?  ?Patient ID: Juan [name] ?MRN: [id number]; DOB: December 09, 1940 ? ?Admit date: 10/31/2021 ?Discharge date: 11/02/2021 ? ?PCP:  Juluis Pitch, MD ?  ?[facility] Providers ?Cardiologist:  Dr. Nehemiah Massed  ? ?Discharge Diagnoses  ?  ?Principal Problem: ?  NSTEMI (non-ST elevated myocardial infarction) ([place]) ?Active Problems: ?  Essential hypertension ?  Hyperlipidemia ?  Non-ST elevation (NSTEMI) myocardial infarction [facility]) ? ?Diagnostic Studies/Procedures  ?  ?Cath: 10/30/21 ?  ?  Prox RCA lesion is 75% stenosed. ?  Mid RCA lesion is 95% stenosed. ?  Prox RCA to Mid RCA lesion is 60% stenosed. ?  Prox LAD lesion is 95% stenosed with 95% stenosed side branch in 1st Sept. ?  LV end diastolic pressure is normal. ?  ?81 year old male with hypertension hyperlipidemia with progressive episodes of chest discomfort consistent with unstable angina with an abnormal stress test consistent with anterior myocardial ischemia ?  ?Echocardiogram showing normal LV systolic function with ejection fraction of 60% and no evidence of significant valvular heart disease ?  ?Left anterior descending artery having a 95% proximal lesion and a large diagonal branch ?Right coronary artery with multiple stenoses at 75% proximal and 95% mid ?  ?  ?Diagnostic ?Dominance: Right ?Trans thoracic Echo: 10/26/2021 ?  ?IMPRESSIONS  ? ? ? 1. Left ventricular ejection fraction, by estimation, is 60 to 65%. The  ?left ventricle has normal function. The left ventricle has no regional  ?wall motion abnormalities. Left ventricular diastolic parameters were  ?normal.  ? 2. Right ventricular systolic function is normal. The right ventricular  ?size is normal.  ? 3. The mitral valve is normal in structure. Mild mitral valve  ?regurgitation. No evidence of mitral stenosis.  ? 4. The aortic valve is normal in structure. Aortic valve regurgitation is  ?trivial. No aortic stenosis is present.  ? 5. The inferior vena cava is normal in size with  greater than 50%  ?respiratory variability, suggesting right atrial pressure of 3 mmHg.  ? ?FINDINGS  ? Left Ventricle: Left ventricular ejection fraction, by estimation, is 60  ?to 65%. The left ventricle has normal function. The left ventricle has no  ?regional wall motion abnormalities. The left ventricular internal cavity  ?size was normal in size. There is  ? no left ventricular hypertrophy. Left ventricular diastolic parameters  ?were normal.  ? ?Right Ventricle: The right ventricular size is normal. No increase in  ?right ventricular wall thickness. Right ventricular systolic function is  ?normal.  ? ?Left Atrium: Left atrial size was normal in size.  ? ?Right Atrium: Right atrial size was normal in size.  ? ?Pericardium: There is no evidence of pericardial effusion.  ? ?Mitral Valve: The mitral valve is normal in structure. Mild mitral valve  ?regurgitation. No evidence of mitral valve stenosis.  ? ?Tricuspid Valve: The tricuspid valve is normal in structure. Tricuspid  ?valve regurgitation is trivial. No evidence of tricuspid stenosis.  ? ?Aortic Valve: The aortic valve is normal in structure. Aortic valve  ?regurgitation is trivial. No aortic stenosis is present. Aortic valve peak  ?gradient measures 7.5 mmHg.  ? ?Pulmonic Valve: The pulmonic valve was not well visualized. Pulmonic valve  ?regurgitation is mild. No evidence of pulmonic stenosis.  ? ?Aorta: The aortic root is normal in size and structure.  ? ?Venous: The inferior vena cava is normal in size with greater than 50%  ?respiratory variability, suggesting right atrial pressure of 3 mmHg.  ? ?IAS/Shunts: No atrial level shunt  detected by color flow Doppler.  ?  ?Cath: 11/01/2021 ?  ?  Prox RCA lesion is 75% stenosed. ?  Mid RCA lesion is 95% stenosed. ?  Prox RCA to Mid RCA lesion is 50% stenosed. ?  Prox LAD to Mid LAD lesion is 99% stenosed. ?  1st Diag lesion is 99% stenosed. ?  A drug-eluting stent was successfully placed using a STENT ONYX  FRONTIER 3.5X12. ?  A drug-eluting stent was successfully placed using a STENT ONYX FRONTIER 3.0X18. ?  A drug-eluting stent was successfully placed using a [place] 2.75X12. ?  Post intervention, there is a 0% residual stenosis. ?  Post intervention, there is a 0% residual stenosis. ?  Post intervention, there is a 0% residual stenosis. ?  ?Severe stenosis mid LAD involving a Diagonal branch. The LAD was successful treated with PTCA with placement of a drug eluting stent.  ?I was unable to wire the Diagonal branch. The Diagonal branch was occluded following LAD stent placement.  ?  ?Severe stenosis proximal and mid RCA.  ?Successful PTCA/DES x 1 proximal RCA.  ?Successful PTCA/DES x 1 mid RCA.  ?  ?Diagnostic ?Dominance: Right ?Intervention ?  ? ?_____________ ?  ?History of Present Illness   ?  ?Juan [name] is a 81 y.o. male with HTN, CKD, HLD, B12 deficiency.  He has not been followed by cardiology in the past. He presented to [facility] with complaints of chest pain. Reported being in his usual state of health until the day prior to admission when he developed chest pain. Was working on his car and unloading an air tank when he developed chest pain that lasted about 10-15 minutes and resolved with rest. Did have associated shortness of breath with episode. Also felt as though he may pass out.  ?  ?In the ED his labs showed Na 142, K 4.0, Cr 1.55, hsTn 25>>58>>32, WBC 6.2, Hgb 10.5>>9.4, BNP 129. EKG showed SB without ischemic changes.  ?  ?He also reported an episode of hematochezia which he thought was hemorrhoidal.  ?  ?He was evaluated by Dr. Corky Sox with [facility] with recommendations for NM stress test on 3/13 which showed abnormal moderate reversible anterior myocardial perfusion defect consistent with myocardial ischemia. He was therefore recommended to undergo cardiac catheterization. Cardiac cath 3/14 showed critical bifurcating left anterior descending artery stenosis at large diagonal and  multiple significant stenoses on a tortuous right coronary artery at up to 95%. Left circumflex artery normal. This was discussed with TCTS at [facility] with recommendations to transfer for further evaluation for possible CABG.  ? ?Hospital Course  ?   ?Consultants: TCTS  ? ?Elevated troponin/Unstable Angina: presented to [facility] with chest pain and found to have mildly elevated troponin. 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 He was transferred to [facility] for evaluation with CT surgery but anatomy felt to be favorable for PCI and patient preferred to avoid surgery. Underwent PCI w/ DES to prox RCA and mid RCA lesions as well as DES to prox LAD. Seen by CR. ?-- Continue DAPT w/ asa '81mg'$  and plavix  ?-- continue high intensity statin, atorvastatin '80mg'$   ?-- Patient's baseline bradycardia precludes beta blocker  ?  ?HTN: Well controlled on amlodipine '5mg'$  daily.  ?-- Continue amlodipine '5mg'$  daily  ?-- baseline bradycardia, therefore no beta blocker ?  ?HLD: LDL 124 ?-- On atorvastatin '80mg'$  daily  ?-- will need FLP/LFTs in 8  weeks  ?  ?IDA: Hgb 9.4-10.4 during admission. TSAT 7%, and ferritin 9. Started on PO iron during admission  ?-- given one dose of venofer prior to discharge and continue oral iron at discharge ?  ?CKD III: baseline around 1.5-1.6. Stable and at baseline  ? ?Patient was seen by Dr. Burt Knack and deemed stable for discharge home. Instructed to follow up in the office with [facility] cardiology at discharge. Medications sent to the [facility] pharmacy. Educated by pharmD prior to discharge.  ?  ? ?Did the patient have an acute coronary syndrome (MI, NSTEMI, STEMI, etc) this admission?:  Yes                              ? ?AHA/ACC Clinical Performance & Quality Measures: ?Aspirin prescribed? - Yes ?ADP Receptor Inhibitor (Plavix/Clopidogrel, Brilinta/Ticagrelor or Effient/Prasugrel) prescribed (includes medically managed patients)? - Yes ?Beta Blocker  prescribed? - No - bradycardia ?High Intensity Statin (Lipitor 40-'80mg'$  or Crestor 20-'40mg'$ ) prescribed? - Yes ?EF assessed during THIS hospitalization? - Yes ?For EF <40%, was ACEI/ARB prescribed? - Not Appli

## 2021-11-02 NOTE — Progress Notes (Signed)
? ?Progress Note ? ?Patient Name: Juan [name] ?Date of Encounter: 11/02/2021 ? ?[facility] Cardiologist: None  ? ?Subjective  ? ?Patient denies any chest pain or shortness of breath. He has been able to ambulate to the restroom without any chest pain or shortness of breath.  ? ?Inpatient Medications  ?  ?Scheduled Meds: ? amLODipine  5 mg Oral Daily  ? aspirin EC  81 mg Oral Daily  ? atorvastatin  80 mg Oral Daily  ? clopidogrel  75 mg Oral Daily  ? ferrous gluconate  324 mg Oral Q breakfast  ? sodium chloride flush  3 mL Intravenous Q12H  ? sodium chloride flush  3 mL Intravenous Q12H  ? sodium chloride flush  3 mL Intravenous Q12H  ? tamsulosin  0.4 mg Oral Daily  ? ?Continuous Infusions: ? sodium chloride 10 mL/hr at 11/01/21 0831  ? sodium chloride    ? iron sucrose    ? ?PRN Meds: ?sodium chloride, sodium chloride, acetaminophen, nitroGLYCERIN, ondansetron (ZOFRAN) IV, sodium chloride flush, sodium chloride flush  ? ?Vital Signs  ?  ?Vitals:  ? 11/01/21 2039 11/02/21 0517 11/02/21 0842 11/02/21 0848  ?BP: 114/69 107/62  113/79  ?Pulse: 66 75    ?Resp: 18 19    ?Temp: 98.3 ?F (36.8 ?C) 98.4 ?F (36.9 ?C) 98.3 ?F (36.8 ?C)   ?TempSrc: Oral Oral Oral   ?SpO2: 96% 96%    ?Weight:  63 kg    ? ? ?Intake/Output Summary (Last 24 hours) at 11/02/2021 1032 ?Last data filed at 11/01/2021 1504 ?Gross per 24 hour  ?Intake 159.43 ml  ?Output --  ?Net 159.43 ml  ? ?Last 3 Weights 11/02/2021 11/01/2021 11/01/2021  ?Weight (lbs) 138 lb 14.4 oz 139 lb 11.2 oz 139 lb 11.2 oz  ?Weight (kg) 63.005 kg 63.368 kg 63.368 kg  ?   ? ?Telemetry  ?  ?SB occasionally, otherwise NSR - Personally Reviewed ? ?ECG  ?  ?NSR ? ?Physical Exam  ?Constitutional: Well-developed, well-nourished, and in no distress.  ?HENT:  ?Head: Normocephalic and atraumatic.   ?Cardiovascular: Normal rate, regular rhythm, intact distal pulses. No gallop and no friction rub.  ?No murmur heard. No lower extremity edema  ?Pulmonary: Non labored breathing on room air,  no wheezing or rales  ?Abdominal: Soft. Normal bowel sounds. Non distended and non tender ?Musculoskeletal: Normal range of motion.     ?   General: No tenderness or edema.  ?Neurological: Alert and oriented to person, place, and time. Non focal  ?Skin: Skin is warm and dry.  ? ?Labs  ?  ?High Sensitivity Troponin:   ?Recent Labs  ?Lab 10/26/21 ?1707 10/26/21 ?1840 10/27/21 ?0902  ?TROPONINIHS 25* 58* 32*  ? ?   ?Chemistry ?Recent Labs  ?Lab 10/31/21 ?3532 11/01/21 ?9924 11/02/21 ?0214  ?NA 141 138 137  ?K 4.2 4.2 4.0  ?CL 111 106 106  ?CO2 '24 24 22  '$ ?GLUCOSE 100* 97 93  ?BUN 31* 32* 22  ?CREATININE 1.58* 1.61* 1.55*  ?CALCIUM 8.7* 8.7* 8.7*  ?PROT  --  5.7*  --   ?ALBUMIN  --  3.3*  --   ?AST  --  31  --   ?ALT  --  44  --   ?ALKPHOS  --  33*  --   ?BILITOT  --  0.4  --   ?GFRNONAA 44* 43* 45*  ?ANIONGAP '6 8 9  '$ ? ?  ?Lipids  ?Recent Labs  ?Lab 10/27/21 ?2683  ?CHOL 208*  ?  TRIG 63  ?HDL 71  ?[place] 124*  ?CHOLHDL 2.9  ? ?  ?Hematology ?Recent Labs  ?Lab 10/31/21 ?3295 11/01/21 ?1884 11/02/21 ?0214  ?WBC 5.9 5.9 5.9  ?RBC 3.72* 3.35* 3.31*  ?HGB 10.5* 9.5* 9.5*  ?HCT 32.5* 29.6* 28.8*  ?MCV 87.4 88.4 87.0  ?MCH 28.2 28.4 28.7  ?MCHC 32.3 32.1 33.0  ?RDW 13.8 14.0 14.3  ?PLT 197 193 186  ? ? ?Thyroid No results for input(s): TSH, FREET4 in the last 168 hours.  ?BNP ?Recent Labs  ?Lab 10/26/21 ?1720  ?BNP 129.0*  ? ?  ?DDimer No results for input(s): DDIMER in the last 168 hours.  ? ?Radiology  ?  ?CARDIAC CATHETERIZATION ? ?Result Date: 11/01/2021 ?  Prox RCA lesion is 75% stenosed.   Mid RCA lesion is 95% stenosed.   Prox RCA to Mid RCA lesion is 50% stenosed.   Prox LAD to Mid LAD lesion is 99% stenosed.   1st Diag lesion is 99% stenosed.   A drug-eluting stent was successfully placed using a STENT ONYX FRONTIER 3.5X12.   A drug-eluting stent was successfully placed using a STENT ONYX FRONTIER 3.0X18.   A drug-eluting stent was successfully placed using a STENT ONYX FRONTIER [id number].   Post intervention, there is a  0% residual stenosis.   Post intervention, there is a 0% residual stenosis.   Post intervention, there is a 0% residual stenosis. Severe stenosis mid LAD involving a Diagonal branch. The LAD was successful treated with PTCA with placement of a drug eluting stent. I was unable to wire the Diagonal branch. The Diagonal branch was occluded following LAD stent placement. Severe stenosis proximal and mid RCA. Successful PTCA/DES x 1 proximal RCA. Successful PTCA/DES x 1 mid RCA.   ? ?Cardiac Studies  ? ?Cath: 10/30/21 ?  ?  Prox RCA lesion is 75% stenosed. ?  Mid RCA lesion is 95% stenosed. ?  Prox RCA to Mid RCA lesion is 60% stenosed. ?  Prox LAD lesion is 95% stenosed with 95% stenosed side branch in 1st Sept. ?  LV end diastolic pressure is normal. ?  ?81 year old male with hypertension hyperlipidemia with progressive episodes of chest discomfort consistent with unstable angina with an abnormal stress test consistent with anterior myocardial ischemia ?  ?Echocardiogram showing normal LV systolic function with ejection fraction of 60% and no evidence of significant valvular heart disease ?  ?Left anterior descending artery having a 95% proximal lesion and a large diagonal branch ?Right coronary artery with multiple stenoses at 75% proximal and 95% mid ?  ?  ?Diagnostic ?Dominance: Right ? ?Trans thoracic Echo: 10/26/2021 ?  ?IMPRESSIONS  ? ? ? 1. Left ventricular ejection fraction, by estimation, is 60 to 65%. The  ?left ventricle has normal function. The left ventricle has no regional  ?wall motion abnormalities. Left ventricular diastolic parameters were  ?normal.  ? 2. Right ventricular systolic function is normal. The right ventricular  ?size is normal.  ? 3. The mitral valve is normal in structure. Mild mitral valve  ?regurgitation. No evidence of mitral stenosis.  ? 4. The aortic valve is normal in structure. Aortic valve regurgitation is  ?trivial. No aortic stenosis is present.  ? 5. The inferior vena cava is  normal in size with greater than 50%  ?respiratory variability, suggesting right atrial pressure of 3 mmHg.  ? ?FINDINGS  ? Left Ventricle: Left ventricular ejection fraction, by estimation, is 60  ?to 65%. The left ventricle has normal function. The  left ventricle has no  ?regional wall motion abnormalities. The left ventricular internal cavity  ?size was normal in size. There is  ? no left ventricular hypertrophy. Left ventricular diastolic parameters  ?were normal.  ? ?Right Ventricle: The right ventricular size is normal. No increase in  ?right ventricular wall thickness. Right ventricular systolic function is  ?normal.  ? ?Left Atrium: Left atrial size was normal in size.  ? ?Right Atrium: Right atrial size was normal in size.  ? ?Pericardium: There is no evidence of pericardial effusion.  ? ?Mitral Valve: The mitral valve is normal in structure. Mild mitral valve  ?regurgitation. No evidence of mitral valve stenosis.  ? ?Tricuspid Valve: The tricuspid valve is normal in structure. Tricuspid  ?valve regurgitation is trivial. No evidence of tricuspid stenosis.  ? ?Aortic Valve: The aortic valve is normal in structure. Aortic valve  ?regurgitation is trivial. No aortic stenosis is present. Aortic valve peak  ?gradient measures 7.5 mmHg.  ? ?Pulmonic Valve: The pulmonic valve was not well visualized. Pulmonic valve  ?regurgitation is mild. No evidence of pulmonic stenosis.  ? ?Aorta: The aortic root is normal in size and structure.  ? ?Venous: The inferior vena cava is normal in size with greater than 50%  ?respiratory variability, suggesting right atrial pressure of 3 mmHg.  ? ?IAS/Shunts: No atrial level shunt detected by color flow Doppler.  ? ?Cath: 11/01/2021 ? ?  Prox RCA lesion is 75% stenosed. ?  Mid RCA lesion is 95% stenosed. ?  Prox RCA to Mid RCA lesion is 50% stenosed. ?  Prox LAD to Mid LAD lesion is 99% stenosed. ?  1st Diag lesion is 99% stenosed. ?  A drug-eluting stent was successfully placed  using a STENT ONYX FRONTIER 3.5X12. ?  A drug-eluting stent was successfully placed using a STENT ONYX FRONTIER 3.0X18. ?  A drug-eluting stent was successfully placed using a [place] [id number].

## 2021-11-02 NOTE — Progress Notes (Signed)
CARDIAC REHAB PHASE I  ? ?Offered to walk with pt. Pt declines, anxious to go home. Pt and daughter educated on importance of ASA and Plavix. Pt agreeable to use pill box. Reviewed site care, restrictions, and exercise guidelines with emphasis on safety. Will refer to CRP II [place]. ? ?[id number] ?Rufina Falco, RN BSN ?11/02/2021 ?11:34 AM ? ?

## 2021-11-03 ENCOUNTER — Other Ambulatory Visit: Payer: Self-pay | Admitting: Physician Assistant

## 2021-11-03 ENCOUNTER — Telehealth: Payer: Self-pay | Admitting: Physician Assistant

## 2021-11-03 DIAGNOSIS — I214 Non-ST elevation (NSTEMI) myocardial infarction: Secondary | ICD-10-CM

## 2021-11-03 DIAGNOSIS — I10 Essential (primary) hypertension: Secondary | ICD-10-CM

## 2021-11-03 MED ORDER — ATORVASTATIN CALCIUM 80 MG PO TABS: 80.0000 mg | ORAL_TABLET | Freq: Every day | ORAL | 3 refills | Status: DC

## 2021-11-03 MED ORDER — AMLODIPINE BESYLATE 5 MG PO TABS: 5.0000 mg | ORAL_TABLET | Freq: Every day | ORAL | 3 refills | Status: DC

## 2021-11-03 NOTE — Telephone Encounter (Signed)
Pt son called with questions regarding discharge medication reconciliation. Pt was taking lisinopril-HCTZ prior to arrival. This was not continued at discharge. He was also taking flomax which was not listed on DC meds. I advised holding lisnopril-HCTZ until follow up with cardiology Dr. Nehemiah Massed.  I advised that if he was taking flomax prior to admission, it should be fine to continue taking this medication, but he needs to check in with his PCP. They also could not find lipitor at home. I sent in a new prescription for the lipitor. Son expressed understanding of the plan.  ?

## 2021-11-05 ENCOUNTER — Telehealth: Payer: Self-pay | Admitting: Cardiology

## 2021-11-05 NOTE — Telephone Encounter (Signed)
Spoke to patient's son Cecilie Lowers.He stated father was discharged from [place] on Fri 3/17.Stated he started taking Amlodipine 5 mg daily yesterday.Stated he is concerned his B/P still elevated.Stated he is concerned he has 3 other medications that he was taking that were not restarted.Medications reviewed and medication list in chart is correct.He was taking Lisinopril 20 mg daily,Tamsulosin 0.4 mg daily,Lipo-Flavonoid 2 tablets twice a day for ears ringing.He wanted to know if he needs to restart these.Son rechecked B/P at present 120/60 pulse 63.Advised just take the medications on discharge instructions.I will send message to Dr.Tobb to see if ok to restart Tamsulosin and Lipo-Flavonoid. ?

## 2021-11-05 NOTE — Telephone Encounter (Signed)
Pt c/o BP issue: STAT if pt c/o blurred vision, one-sided weakness or slurred speech ? ?1. What are your last 5 BP readings? 168/70 HR 58 ?145/65 HR 60 ?165/70 HR 60 ? ?2. Are you having any other symptoms (ex. Dizziness, headache, blurred vision, passed out)? no ? ?3. What is your BP issue? Son states his BP is running high.  They put him on amLODipine (NORVASC) 5 MG tablet, he wants to know if he can go back on his last BP medication lisinopril 20 mg.   ?

## 2021-11-06 ENCOUNTER — Telehealth (HOSPITAL_BASED_OUTPATIENT_CLINIC_OR_DEPARTMENT_OTHER): Payer: Self-pay

## 2021-11-06 ENCOUNTER — Other Ambulatory Visit (HOSPITAL_BASED_OUTPATIENT_CLINIC_OR_DEPARTMENT_OTHER): Payer: Self-pay

## 2021-11-06 NOTE — Telephone Encounter (Signed)
Spoke to patient's son Cecilie Lowers.Dr.Tobb's advice given.Appointment scheduled with Dr.Tobb 11/08/21 at 11:30 am.Advised to bring all medications to appointment. ?

## 2021-11-06 NOTE — Telephone Encounter (Signed)
Transitions of Care Pharmacy  ? ?Call attempted for a pharmacy transitions of care follow-up. HIPAA appropriate voicemail was left with call back information provided.  ? ?Call attempt #1. Will follow-up in 2-3 days.  ?  ? ?Darcus Austin, PharmD ?Clinical Pharmacist ?[facility] ?11/06/2021 3:43 PM ? ?

## 2021-11-07 ENCOUNTER — Telehealth (HOSPITAL_COMMUNITY): Payer: Self-pay

## 2021-11-07 NOTE — Telephone Encounter (Signed)
Transitions of Care Pharmacy   Call attempted for a pharmacy transitions of care follow-up. HIPAA appropriate voicemail was left with call back information provided.   Call attempt #2. Will follow-up in 2-3 days.    

## 2021-11-08 ENCOUNTER — Telehealth (HOSPITAL_COMMUNITY): Payer: Self-pay

## 2021-11-08 ENCOUNTER — Ambulatory Visit: Payer: Medicare Other | Admitting: Cardiology

## 2021-11-08 ENCOUNTER — Other Ambulatory Visit (HOSPITAL_COMMUNITY): Payer: Self-pay

## 2021-11-08 ENCOUNTER — Other Ambulatory Visit: Payer: Self-pay

## 2021-11-08 ENCOUNTER — Encounter: Payer: Self-pay | Admitting: Cardiology

## 2021-11-08 VITALS — BP 142/78 | HR 53 | Ht 65.0 in | Wt 146.2 lb

## 2021-11-08 DIAGNOSIS — E782 Mixed hyperlipidemia: Secondary | ICD-10-CM

## 2021-11-08 DIAGNOSIS — Z79899 Other long term (current) drug therapy: Secondary | ICD-10-CM

## 2021-11-08 DIAGNOSIS — I2510 Atherosclerotic heart disease of native coronary artery without angina pectoris: Secondary | ICD-10-CM | POA: Insufficient documentation

## 2021-11-08 DIAGNOSIS — I10 Essential (primary) hypertension: Secondary | ICD-10-CM

## 2021-11-08 LAB — CBC WITH DIFFERENTIAL/PLATELET
Basophils Absolute: 0 10*3/uL (ref 0.0–0.2)
Basos: 1 %
EOS (ABSOLUTE): 0.2 10*3/uL (ref 0.0–0.4)
Eos: 3 %
Hematocrit: 33.1 % — ABNORMAL LOW (ref 37.5–51.0)
Hemoglobin: 10.7 g/dL — ABNORMAL LOW (ref 13.0–17.7)
Immature Grans (Abs): 0 10*3/uL (ref 0.0–0.1)
Immature Granulocytes: 0 %
Lymphocytes Absolute: 1.5 10*3/uL (ref 0.7–3.1)
Lymphs: 22 %
MCH: 29 pg (ref 26.6–33.0)
MCHC: 32.3 g/dL (ref 31.5–35.7)
MCV: 90 fL (ref 79–97)
Monocytes Absolute: 0.7 10*3/uL (ref 0.1–0.9)
Monocytes: 11 %
Neutrophils Absolute: 4.3 10*3/uL (ref 1.4–7.0)
Neutrophils: 63 %
Platelets: 230 10*3/uL (ref 150–450)
RBC: 3.69 x10E6/uL — ABNORMAL LOW (ref 4.14–5.80)
RDW: 14.5 % (ref 11.6–15.4)
WBC: 6.8 10*3/uL (ref 3.4–10.8)

## 2021-11-08 NOTE — Telephone Encounter (Signed)
Pharmacy Transitions of Care Follow-up Telephone Call ? ?Date of discharge: 11/02/21  ?Discharge Diagnosis: NSTEMI ? ?How have you been since you were released from the hospital? Patient doing well since discharge, no questions about medications at this time. Patient is very annoyed by singing in his apartment. Someone in his complex has been singing nonstop since he came home from the hospital. Encouraged patient to tell his doctor at today's follow up. ? ?Medication changes made at discharge: ?START taking: ?clopidogrel (PLAVIX)  ?ferrous gluconate (FERGON)  ?nitroGLYCERIN (NITROSTAT)  ?Icon medications to stop taking   STOP taking: ?amLODipine 5 MG tablet (NORVASC)  ?atorvastatin 80 MG tablet (LIPITOR)  ?heparin 25000 UT/250ML infusion  ? ?Medication changes verified by the patient? Yes ?  ? ?Medication Accessibility: ? ?Home Pharmacy: [facility]  ? ?Was the patient provided with refills on discharged medications? Yes  ? ?Have all prescriptions been transferred from [facility] to home pharmacy? Yes  ? ?Is the patient able to afford medications? Has insurance ?  ? ?Medication Review: ? ?CLOPIDOGREL (PLAVIX) ?Clopidogrel 75 mg once daily.  ?- Advised patient of medications to avoid (NSAIDs, ASA)  ?- Educated that Tylenol (acetaminophen) will be the preferred analgesic to prevent risk of bleeding  ?- Emphasized importance of monitoring for signs and symptoms of bleeding (abnormal bruising, prolonged bleeding, nose bleeds, bleeding from gums, discolored urine, black tarry stools)  ?- Advised patient to alert all providers of anticoagulation therapy prior to starting a new medication or having a procedure  ? ?Follow-up Appointments: ? ?[facility] f/u appt confirmed? Scheduled to see Dr. Harriet Masson on 11/08/21 @ 11:30AM.  ? ?If their condition worsens, is the pt aware to call PCP or go to the Emergency Dept.? Yes ? ?Final Patient Assessment: ?Patient has follow up scheduled and refills at home pharmacy ? ?

## 2021-11-08 NOTE — Patient Instructions (Addendum)
Medication Instructions:  ?Your physician recommends that you continue on your current medications as directed. Please refer to the Current Medication list given to you today.  ?*If you need a refill on your cardiac medications before your next appointment, please call your pharmacy* ? ? ?Lab Work: ?Your physician recommends that you return for lab work in:  ?TODAY: CBC ?If you have labs (blood work) drawn today and your tests are completely normal, you will receive your results only by: ?MyChart Message (if you have MyChart) OR ?A paper copy in the mail ?If you have any lab test that is abnormal or we need to change your treatment, we will call you to review the results. ? ? ?Testing/Procedures: ?None ? ? ?Follow-Up: ?At [facility], you and your health needs are our priority.  As part of our continuing mission to provide you with exceptional heart care, we have created designated Provider Care Teams.  These Care Teams include your primary Cardiologist (physician) and Advanced Practice Providers (APPs -  Physician Assistants and Nurse Practitioners) who all work together to provide you with the care you need, when you need it. ? ?We recommend signing up for the patient portal called "MyChart".  Sign up information is provided on this After Visit Summary.  MyChart is used to connect with patients for Virtual Visits (Telemedicine).  Patients are able to view lab/test results, encounter notes, upcoming appointments, etc.  Non-urgent messages can be sent to your provider as well.   ?To learn more about what you can do with MyChart, go to [email or web address].   ? ?Your next appointment:   ?3 month(s) ? ?The format for your next appointment:   ?In Person ? ?Provider:   ?Lauree Chandler, MD   ? ? ?Other Instructions ?  ?

## 2021-11-08 NOTE — Progress Notes (Signed)
?Cardiology Office Note:   ? ?Date:  11/08/2021  ? ?ID:  Juan [name], DOB [birth year], MRN [id number] ? ?PCP:  Juluis Pitch, MD  ?Cardiologist:  Lauree Chandler, MD  ?Electrophysiologist:  None  ? ?Referring MD: Juluis Pitch, MD  ? ?" I am doing heavy people singing in my ear" ? ?History of Present Illness:   ? ?Juan [name] is a 81 y.o. 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 ? ?Per chart review he was evaluated by Dr. Corky Sox with [facility] cardiology with recommendations for NM stress test on 3/13 which showed abnormal moderate reversible anterior myocardial perfusion defect consistent with myocardial ischemia. He was therefore recommended to undergo cardiac catheterization. 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 Left circumflex artery normal. This was discussed with TCTS at [facility] with recommendations to transfer for further evaluation for possible CABG. ? ?While in [facility] he underwent PCI.  He is here today for follow-up visit.  The patient tells me since he went back home he feels like somebody is singing to him at night.  But he does not see the person.  He tells me that sometimes he tells the person to stop thinking does stop thinking for a little bit and then starts again.  His daughter thinks that he is experiencing also ringing in the ears because he has had ear tubes in the past. ? ?They are also concerned about his blood pressure which is running into the 140s at home.  He brought his blood pressure cuff with him today I was able to verify that his cough in the is working well.  He had questions about his tamsulosin and if he can restart this medication.  I do not see any reason why he cannot  restart his medication.  I have also asked him to discuss with the PCP as well. ? ?No chest pain no shortness of breath. ? ? ?Past Medical History:  ?Diagnosis Date  ? Arthritis   ? Cataracts, both eyes   ? 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 History of kidney stones   ? Hypertension   ? ? ?Past Surgical History:  ?Procedure Laterality Date  ? APPENDECTOMY    ? COLONOSCOPY WITH PROPOFOL N/A 09/29/2018  ? Procedure: COLONOSCOPY WITH PROPOFOL;  Surgeon: Lollie Sails, MD;  Location: [facility] ENDOSCOPY;  Service: Endoscopy;  Laterality: N/A;  ? COLONOSCOPY WITH PROPOFOL N/A 04/15/2019  ? Procedure: COLONOSCOPY WITH PROPOFOL;  Surgeon: Lollie Sails, MD;  Location: [facility] ENDOSCOPY;  Service: Endoscopy;  Laterality: N/A;  ? CORONARY STENT INTERVENTION N/A 11/01/2021  ? Procedure: CORONARY STENT INTERVENTION;  Surgeon: Burnell Blanks, MD;  Location: [facility] CV LAB;  Service: Cardiovascular;  Laterality: N/A;  ? LEFT HEART CATH AND CORONARY ANGIOGRAPHY N/A 10/30/2021  ? Procedure: LEFT HEART CATH AND CORONARY ANGIOGRAPHY;  Surgeon: Corey Skains, MD;  Location: [place] CV LAB;  Service: Cardiovascular;  Laterality: N/A;  ? WART FULGURATION N/A 06/10/2019  ? Procedure: FULGURATION ANAL WART;  Surgeon: Benjamine Sprague, DO;  Location: [facility] ORS;  Service: General;  Laterality: N/A;  ? ? ?Current Medications: ?Current Meds  ?Medication Sig  ? amLODipine (NORVASC) 5 MG tablet Take 1 tablet (5 mg  total) by mouth daily.  ? aspirin 81 MG EC tablet Take 1 tablet (81 mg total) by mouth daily. Swallow whole.  ? atorvastatin (LIPITOR) 80 MG tablet Take 1 tablet (80 mg total) by mouth daily.  ? clopidogrel (PLAVIX) 75 MG tablet Take 1 tablet (75 mg total) by mouth daily.  ? ferrous gluconate (FERGON) 324 MG tablet Take 1 tablet (324 mg total) by mouth daily with breakfast.  ? nitroGLYCERIN (NITROSTAT) 0.4 MG SL tablet Place 1 tablet (0.4 mg total) under the tongue every 5 (five) minutes x 3 doses as needed for chest pain.   ? Vitamins-Lipotropics (LIPO FLAVONOID PLUS PO) Take by mouth 2 (two) times daily. 2 tabs 2 times daily  ?  ? ?Allergies:   Sulfa antibiotics  ? ?Social History  ? ?Socioeconomic History  ? Marital status: Divorced  ?  Spouse name: Not on file  ? Number of children: Not on file  ? Years of education: Not on file  ? Highest education level: Not on file  ?Occupational History  ? Not on file  ?Tobacco Use  ? Smoking status: Never  ? Smokeless tobacco: Never  ?Vaping Use  ? Vaping Use: Never used  ?Substance and Sexual Activity  ? Alcohol use: No  ? Drug use: No  ? Sexual activity: Not on file  ?Other Topics Concern  ? Not on file  ?Social History Narrative  ? Not on file  ? ?Social Determinants of Health  ? ?Financial Resource Strain: Not on file  ?Food Insecurity: Not on file  ?Transportation Needs: Not on file  ?Physical Activity: Not on file  ?Stress: Not on file  ?Social Connections: Not on file  ?  ? ?Family History: ?The patient's family history includes Hypertension in his father. ? ?ROS:   ?Review of Systems  ?Constitution: Negative for decreased appetite, fever and weight gain.  ?HENT: Negative for congestion, ear discharge, hoarse voice and sore throat.   ?Eyes: Negative for discharge, redness, vision loss in right eye and visual halos.  ?Cardiovascular: Negative for chest pain, dyspnea on exertion, leg swelling, orthopnea and palpitations.  ?Respiratory: Negative for cough, hemoptysis, shortness of breath and snoring.   ?Endocrine: Negative for heat intolerance and polyphagia.  ?Hematologic/Lymphatic: Negative for bleeding problem. Does not bruise/bleed easily.  ?Skin: Negative for flushing, nail changes, rash and suspicious lesions.  ?Musculoskeletal: Negative for arthritis, joint pain, muscle cramps, myalgias, neck pain and stiffness.  ?Gastrointestinal: Negative for abdominal pain, bowel incontinence, diarrhea and excessive appetite.  ?Genitourinary: Negative for decreased libido, genital sores and  incomplete emptying.  ?Neurological: Negative for brief paralysis, focal weakness, headaches and loss of balance.  ?Psychiatric/Behavioral: Negative for altered mental status, depression and suicidal ideas.  ?Allergic/Immunologic: Negative for HIV exposure and persistent infections.  ? ? ?EKGs/Labs/Other Studies Reviewed:   ? ?The following studies were reviewed today: ? ? ?EKG: None today ? ?Trans thoracic Echo: 10/26/2021 ? IMPRESSIONS  ? 1. Left ventricular ejection fraction, by estimation, is 60 to 65%. The left ventricle has normal function. The left ventricle has no regional wall motion abnormalities. Left ventricular diastolic parameters were normal.  ? 2. Right ventricular systolic function is normal. The right ventricular size is normal.  ? 3. The mitral valve is normal in structure. Mild mitral valve regurgitation. No evidence of mitral stenosis.  ? 4. The aortic valve is normal in structure. Aortic valve regurgitation is trivial. No aortic stenosis is present.  ? 5. The inferior vena cava is normal in size  with greater than 50% respiratory variability, suggesting right atrial pressure of 3 mmHg.  ? ?FINDINGS  ? Left Ventricle: Left ventricular ejection fraction, by estimation, is 60 to 65%. The left ventricle has normal function. The left ventricle has no regional wall motion abnormalities. The left ventricular internal cavity size was normal in size. There is  no left ventricular hypertrophy. Left ventricular diastolic parameters were normal.  ? ?Right Ventricle: The right ventricular size is normal. No increase in  ?right ventricular wall thickness. Right ventricular systolic function is  ?normal.  ? ?Left Atrium: Left atrial size was normal in size.  ? ?Right Atrium: Right atrial size was normal in size.  ? ?Pericardium: There is no evidence of pericardial effusion.  ? ?Mitral Valve: The mitral valve is normal in structure. Mild mitral valve  ?regurgitation. No evidence of mitral valve stenosis.   ? ?Tricuspid Valve: The tricuspid valve is normal in structure. Tricuspid  ?valve regurgitation is trivial. No evidence of tricuspid stenosis.  ? ?Aortic Valve: The aortic valve is normal in structure. Aortic valve

## 2021-11-29 ENCOUNTER — Ambulatory Visit: Payer: Medicare Other | Admitting: Cardiology

## 2021-11-30 ENCOUNTER — Telehealth: Payer: Self-pay | Admitting: Cardiology

## 2021-11-30 NOTE — Telephone Encounter (Signed)
Patient's son would like a copy of his father's medication list.  He is the one who help's his father with his medications.  ?

## 2021-11-30 NOTE — Telephone Encounter (Signed)
-   With verbal permission, spoke to pt son who wanted to go over pt's medication list. ?-Med list reviewed and a copy placed in the mail  ?

## 2021-12-03 ENCOUNTER — Encounter: Payer: Self-pay | Admitting: Cardiology

## 2021-12-07 ENCOUNTER — Telehealth: Payer: Self-pay | Admitting: Cardiology

## 2021-12-07 NOTE — Telephone Encounter (Signed)
Daughter informed that hemoglobin is stable. She said patient is taking iron supplements as prescribed. Patient teaching regarding iron on preventing constipation and that bowel movements may look very dark. ?

## 2021-12-07 NOTE — Telephone Encounter (Signed)
Daughter calling in bout patient labs. Please advise ?

## 2021-12-27 ENCOUNTER — Other Ambulatory Visit: Payer: Self-pay | Admitting: Cardiology

## 2022-01-20 ENCOUNTER — Other Ambulatory Visit: Payer: Self-pay | Admitting: Cardiology

## 2022-01-21 NOTE — Telephone Encounter (Signed)
Rx refill sent to pharmacy. 

## 2022-02-18 ENCOUNTER — Other Ambulatory Visit: Payer: Self-pay | Admitting: Cardiology

## 2022-03-08 DIAGNOSIS — H903 Sensorineural hearing loss, bilateral: Secondary | ICD-10-CM | POA: Insufficient documentation

## 2022-03-08 DIAGNOSIS — H7202 Central perforation of tympanic membrane, left ear: Secondary | ICD-10-CM | POA: Insufficient documentation

## 2022-03-17 NOTE — Progress Notes (Unsigned)
No chief complaint on file.  History of Present Illness:81 yo male with history of CAD, CKD, HLD and HTN who is here today for cardiac follow up.  He was seen by [facility] in [place], [place] in March 2023 with chest pain and had an abnormal nuclear stress test. Cardiac cath at [facility] with severe LAD stenosis and severe RCA stenosis. He was transferred to [facility] for CABG consult but his anatomy was not felt to be suitable for CABG. He underwent PCI with placement of a drug eluting stent in the LAD and two drug eluting stents in the RCA on 3/16//23. Echo 10/28/21 with LVEF=60-65%, mild MR. He was discharged on ASA and Plavix.   He is here today for follow up. The patient denies any chest pain, dyspnea, palpitations, lower extremity edema, orthopnea, PND, dizziness, near syncope or syncope.   Primary Care Physician: Juluis Pitch, MD   Past Medical History:  Diagnosis Date   Arthritis    Cataracts, both eyes    Hearing loss    left ear   History of kidney stones    Hypertension     Past Surgical History:  Procedure Laterality Date   APPENDECTOMY     COLONOSCOPY WITH PROPOFOL N/A 09/29/2018   Procedure: COLONOSCOPY WITH PROPOFOL;  Surgeon: Lollie Sails, MD;  Location: [facility] ENDOSCOPY;  Service: Endoscopy;  Laterality: N/A;   COLONOSCOPY WITH PROPOFOL N/A 04/15/2019   Procedure: COLONOSCOPY WITH PROPOFOL;  Surgeon: Lollie Sails, MD;  Location: [facility] ENDOSCOPY;  Service: Endoscopy;  Laterality: N/A;   CORONARY STENT INTERVENTION N/A 11/01/2021   Procedure: CORONARY STENT INTERVENTION;  Surgeon: Burnell Blanks, MD;  Location: [place] CV LAB;  Service: Cardiovascular;  Laterality: N/A;   LEFT HEART CATH AND CORONARY ANGIOGRAPHY N/A 10/30/2021   Procedure: LEFT HEART CATH AND CORONARY ANGIOGRAPHY;  Surgeon: Corey Skains, MD;  Location: [place] CV LAB;  Service: Cardiovascular;  Laterality: N/A;   WART FULGURATION N/A 06/10/2019   Procedure: FULGURATION  ANAL WART;  Surgeon: Benjamine Sprague, DO;  Location: [facility] ORS;  Service: General;  Laterality: N/A;    Current Outpatient Medications  Medication Sig Dispense Refill   amLODipine (NORVASC) 5 MG tablet Take 1 tablet (5 mg total) by mouth daily. 90 tablet 3   aspirin 81 MG EC tablet Take 1 tablet (81 mg total) by mouth daily. Swallow whole. 90 tablet 2   atorvastatin (LIPITOR) 80 MG tablet Take 1 tablet (80 mg total) by mouth daily. 90 tablet 3   clopidogrel (PLAVIX) 75 MG tablet Take 1 tablet (75 mg total) by mouth daily. 90 tablet 2   ferrous gluconate (FERGON) 324 MG tablet TAKE 1 TABLET ('324MG'$  TOTAL) BY MOUTH DAILY WITH BREAKFAST 30 tablet 1   nitroGLYCERIN (NITROSTAT) 0.4 MG SL tablet Place 1 tablet (0.4 mg total) under the tongue every 5 (five) minutes x 3 doses as needed for chest pain. 25 tablet 1   Vitamins-Lipotropics (LIPO FLAVONOID PLUS PO) Take by mouth 2 (two) times daily. 2 tabs 2 times daily     No current facility-administered medications for this visit.    Allergies  Allergen Reactions   Sulfa Antibiotics Other (See Comments)    Unknown reaction    Social History   Socioeconomic History   Marital status: Divorced    Spouse name: Not on file   Number of children: Not on file   Years of education: Not on file   Highest education level: Not on file  Occupational History   Not on file  Tobacco Use   Smoking status: Never   Smokeless tobacco: Never  Vaping Use   Vaping Use: Never used  Substance and Sexual Activity   Alcohol use: No   Drug use: No   Sexual activity: Not on file  Other Topics Concern   Not on file  Social History Narrative   Not on file   Social Determinants of Health   Financial Resource Strain: Not on file  Food Insecurity: Not on file  Transportation Needs: Not on file  Physical Activity: Not on file  Stress: Not on file  Social Connections: Not on file  Intimate Partner Violence: Not on file    Family History  Problem Relation Age  of Onset   Hypertension Father     Review of Systems:  As stated in the HPI and otherwise negative.   There were no vitals taken for this visit.  Physical Examination: General: Well developed, well nourished, NAD  HEENT: OP clear, mucus membranes moist  SKIN: warm, dry. No rashes. Neuro: No focal deficits  Musculoskeletal: Muscle strength 5/5 all ext  Psychiatric: Mood and affect normal  Neck: No JVD, no carotid bruits, no thyromegaly, no lymphadenopathy.  Lungs:Clear bilaterally, no wheezes, rhonci, crackles Cardiovascular: Regular rate and rhythm. No murmurs, gallops or rubs. Abdomen:Soft. Bowel sounds present. Non-tender.  Extremities: No lower extremity edema. Pulses are 2 + in the bilateral DP/PT.  EKG:  EKG {ACTION; IS/IS QMV:[id number]} ordered today. The ekg ordered today demonstrates ***  Echo 10/28/21:  1. Left ventricular ejection fraction, by estimation, is 60 to 65%. The  left ventricle has normal function. The left ventricle has no regional  wall motion abnormalities. Left ventricular diastolic parameters were  normal.   2. Right ventricular systolic function is normal. The right ventricular  size is normal.   3. The mitral valve is normal in structure. Mild mitral valve  regurgitation. No evidence of mitral stenosis.   4. The aortic valve is normal in structure. Aortic valve regurgitation is  trivial. No aortic stenosis is present.   5. The inferior vena cava is normal in size with greater than 50%  respiratory variability, suggesting right atrial pressure of 3 mmHg.   Recent Labs: 04/09/2021: Magnesium 2.1 10/26/2021: B Natriuretic Peptide 129.0 11/01/2021: ALT 44 11/02/2021: BUN 22; Creatinine, Ser 1.55; Potassium 4.0; Sodium 137 11/08/2021: Hemoglobin 10.7; Platelets 230   Lipid Panel    Component Value Date/Time   CHOL 208 (H) 10/27/2021 0514   TRIG 63 10/27/2021 0514   HDL 71 10/27/2021 0514   CHOLHDL 2.9 10/27/2021 0514   VLDL 13 10/27/2021 0514    LDLCALC 124 (H) 10/27/2021 0514     Wt Readings from Last 3 Encounters:  11/08/21 146 lb 3.2 oz (66.3 kg)  11/02/21 138 lb 14.4 oz (63 kg)  10/30/21 139 lb 9.6 oz (63.3 kg)     Assessment and Plan:   1. CAD without angina: Doing well post PCI in March 2023. Will plan to continue DAPT with ASA and Plavix for one year post PCI. Continue statin.   2. HTN: BP well controlled. No changes  3. Hyperlipidemia: LDL was 124 in March 2023. He needs repeat lipids now.   Labs/ tests ordered today include:  No orders of the defined types were placed in this encounter.    Disposition:   F/U with me in one year.    Signed, Lauree Chandler, MD 03/17/2022 9:06 AM    [facility] [place], [place], [place]  [address] Phone: [phone]; Fax: [phone]

## 2022-03-18 ENCOUNTER — Encounter: Payer: Self-pay | Admitting: Cardiovascular Disease

## 2022-03-18 ENCOUNTER — Ambulatory Visit: Payer: Medicare Other | Admitting: Cardiovascular Disease

## 2022-03-18 VITALS — BP 110/60 | HR 68 | Ht 65.0 in | Wt 140.4 lb

## 2022-03-18 DIAGNOSIS — E782 Mixed hyperlipidemia: Secondary | ICD-10-CM | POA: Diagnosis not present

## 2022-03-18 DIAGNOSIS — I2510 Atherosclerotic heart disease of native coronary artery without angina pectoris: Secondary | ICD-10-CM

## 2022-03-18 DIAGNOSIS — I10 Essential (primary) hypertension: Secondary | ICD-10-CM

## 2022-03-18 NOTE — Patient Instructions (Signed)
Medication Instructions:  No changes *If you need a refill on your cardiac medications before your next appointment, please call your pharmacy*   Lab Work: none If you have labs (blood work) drawn today and your tests are completely normal, you will receive your results only by: [facility] (if you have MyChart) OR A paper copy in the mail If you have any lab test that is abnormal or we need to change your treatment, we will call you to review the results.   Testing/Procedures: none   Follow-Up: At [facility], you and your health needs are our priority.  As part of our continuing mission to provide you with exceptional heart care, we have created designated Provider Care Teams.  These Care Teams include your primary Cardiologist (physician) and Advanced Practice Providers (APPs -  Physician Assistants and Nurse Practitioners) who all work together to provide you with the care you need, when you need it.  We recommend signing up for the patient portal called "MyChart".  Sign up information is provided on this After Visit Summary.  MyChart is used to connect with patients for Virtual Visits (Telemedicine).  Patients are able to view lab/test results, encounter notes, upcoming appointments, etc.  Non-urgent messages can be sent to your provider as well.   To learn more about what you can do with MyChart, go to [email or web address].    Your next appointment:   8 month(s) (March 2024)  The format for your next appointment:   In Person  Provider:   Lauree Chandler, MD     Other Instructions   Important Information About Sugar

## 2022-03-19 ENCOUNTER — Other Ambulatory Visit: Payer: Self-pay | Admitting: Cardiology

## 2022-03-21 ENCOUNTER — Telehealth: Payer: Self-pay | Admitting: *Deleted

## 2022-03-21 NOTE — Telephone Encounter (Signed)
   Pre-operative Risk Assessment    Patient Name: Juan [name]  DOB: November 23, 1940 MRN: [id number]      Request for Surgical Clearance    Procedure:   COCHLEAR IMPLANTATION  Date of Surgery:  Clearance 05/08/22                                 Surgeon:  DR. Bethena Midget Surgeon's Group or Practice Name:  [facility] Phone number:  [phone] Fax number:  [phone]   Type of Clearance Requested:   - Medical  - Pharmacy:  Hold Clopidogrel (Plavix) PT IS ON ASA AS WELL; THOUGH ASA IS NOT BEING REQUESTED TO BE HELD ONLY HOLD PLAVIX   Type of Anesthesia:  General    Additional requests/questions:    Jiles Prows   03/21/2022, 5:32 PM

## 2022-03-22 NOTE — Telephone Encounter (Signed)
   Name: Juan [name]  DOB: Jul 22, 1941  MRN: [id number]  Primary Cardiologist: Lauree Chandler, MD   Preoperative team, please contact this patient and set up a phone call appointment for further preoperative risk assessment. Please obtain consent and complete medication review. Thank you for your help.  Please schedule follow-up visit for patient in September to review hold instructions for Plavix and current status post PCI.  I confirm that guidance regarding antiplatelet and oral anticoagulation therapy has been completed and, if necessary, noted below.  Per Dr. Angelena Form patient can hold Plavix after 05/04/2022 for your surgery.   Mable Fill, Marissa Nestle, NP 03/22/2022, 10:49 AM [facility] [address] [place], [place] [address]

## 2022-03-22 NOTE — Telephone Encounter (Signed)
Left message for the pt to call back for tele pre op appt. Per pre op provider to set up tele the 1 st week of sept .

## 2022-03-27 NOTE — Telephone Encounter (Signed)
Left message x 2 to call back for tele pre op appt.  

## 2022-04-02 NOTE — Telephone Encounter (Signed)
I tried to call the pt again today, and left vm today. I then called pt's son DPR on file. Son said he is not sure if he can off to be with dad for a tele visit due his job. 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 Pt's son said he will reach out to his sister (DPR) to see if she may be able to arrange time to be with their dad for the tele visit. Pt's son said he or his sister will call back to set up a tele visit. I thanked pt's son for the help. I will update the requesting office that I just s/w the pt's sone as well.

## 2022-04-02 NOTE — Telephone Encounter (Signed)
REQUESTING OFFICE RE-SENT CLEARANCE REQUEST STATING 2ND ATTEMPT. I WILL UPDATE THE REQUESTING OFFICE THAT WE HAVE TRIED TO REACH THE PT x 2 TO CALL BACK FOR A TELEPHONE APPT FOR PRE OP CLEARANCE. WE ARE WAITING FOR PT TO CALL BACK.   [facility], prides themselves in caring for our pt's in caring an timely manner.

## 2022-04-03 ENCOUNTER — Telehealth: Payer: Self-pay | Admitting: *Deleted

## 2022-04-03 NOTE — Telephone Encounter (Signed)
I tried to call the pt's son back, but his vm is full. I could not leave a message for call back.

## 2022-04-03 NOTE — Telephone Encounter (Signed)
Pt's son is returning call on his break to schedule preop tele visit. Transferred to CMA, Arbie Cookey.

## 2022-04-03 NOTE — Telephone Encounter (Signed)
Pt son calling back to schedule tele visit.

## 2022-04-03 NOTE — Telephone Encounter (Signed)
DPR on file for pt's son Juan [name]. Tele pre op appt set for 04/18/22 @ 2 pm. Med rec and consent are done.     Patient Consent for Virtual Visit        Juan [name] has provided verbal consent on 04/03/2022 for a virtual visit (video or telephone).   CONSENT FOR VIRTUAL VISIT FOR:  Juan [name]  By participating in this virtual visit I agree to the following:  I hereby voluntarily request, consent and authorize [facility] and its employed or contracted physicians, physician assistants, nurse practitioners or other licensed health care professionals (the Practitioner), to provide me with telemedicine health care services (the "Services") as deemed necessary by the treating Practitioner. I acknowledge and consent to receive the Services by the Practitioner via telemedicine. I understand that the telemedicine visit will involve communicating with the Practitioner through live audiovisual communication technology and the disclosure of certain medical information by electronic transmission. I acknowledge that I have been given the opportunity to request an in-person assessment or other available alternative prior to the telemedicine visit and am voluntarily participating in the telemedicine visit.  I understand that I have the right to withhold or withdraw my consent to the use of telemedicine in the course of my care at any time, without affecting my right to future care or treatment, and that the Practitioner or I may terminate the telemedicine visit at any time. I understand that I have the right to inspect all information obtained and/or recorded in the course of the telemedicine visit and may receive copies of available information for a reasonable fee.  I understand that some of the potential risks of receiving the Services via telemedicine include:  Delay or interruption in medical evaluation due to technological equipment failure or disruption; Information transmitted may not be sufficient  (e.g. poor resolution of images) to allow for appropriate medical decision making by the Practitioner; and/or  In rare instances, security protocols could fail, causing a breach of personal health information.  Furthermore, I acknowledge that it is my responsibility to provide information about my medical history, conditions and care that is complete and accurate to the best of my ability. I acknowledge that Practitioner's advice, recommendations, and/or decision may be based on factors not within their control, such as incomplete or inaccurate data provided by me or distortions of diagnostic images or specimens that may result from electronic transmissions. I understand that the practice of medicine is not an exact science and that Practitioner makes no warranties or guarantees regarding treatment outcomes. I acknowledge that a copy of this consent can be made available to me via my patient portal ([facility]), or I can request a printed copy by calling the office of [facility].    I understand that my insurance will be billed for this visit.   I have read or had this consent read to me. I understand the contents of this consent, which adequately explains the benefits and risks of the Services being provided via telemedicine.  I have been provided ample opportunity to ask questions regarding this consent and the Services and have had my questions answered to my satisfaction. I give my informed consent for the services to be provided through the use of telemedicine in my medical care

## 2022-04-03 NOTE — Telephone Encounter (Signed)
DPR on file for pt's son Juan [name]. Tele pre op appt set for 04/18/22 @ 2 pm. Med rec and consent are done.

## 2022-04-03 NOTE — Telephone Encounter (Signed)
Pt daughter called back to schedule a tele preop visit. She states there is some phone tag because her and her brother are at work and can only call on their breaks. She wants to know if she or her brother can get a call back close to 5 around 4:45pm.

## 2022-04-18 ENCOUNTER — Ambulatory Visit: Payer: Medicare Other | Attending: Cardiology | Admitting: Physician Assistant

## 2022-04-18 DIAGNOSIS — Z01810 Encounter for preprocedural cardiovascular examination: Secondary | ICD-10-CM | POA: Diagnosis not present

## 2022-04-18 NOTE — Progress Notes (Signed)
Virtual Visit via Telephone Note   Because of Juan [name]'s co-morbid illnesses, he is at least at moderate risk for complications without adequate follow up.  This format is felt to be most appropriate for this patient at this time.  The patient did not have access to video technology/had technical difficulties with video requiring transitioning to audio format only (telephone).  All issues noted in this document were discussed and addressed.  No physical exam could be performed with this format.  Please refer to the patient's chart for his consent to telehealth for [facility].  Evaluation Performed:  Preoperative cardiovascular risk assessment _____________   Date:  04/18/2022   Patient ID:  Juan [name], DOB 04/18/1941, MRN [id number] Patient Location:  Home Provider location:   Office  Primary Care Provider:  Juluis Pitch, MD Primary Cardiologist:  Lauree Chandler, MD  Chief Complaint / Patient Profile   81 y.o. y/o male with a h/o CAD, CKD, HTN and HLD who is pending cochlear implantation and presents today for telephonic preoperative cardiovascular risk assessment.  Past Medical History    Past Medical History:  Diagnosis Date   Arthritis    Cataracts, both eyes    Hearing loss    left ear   History of kidney stones    Hypertension    Past Surgical History:  Procedure Laterality Date   APPENDECTOMY     COLONOSCOPY WITH PROPOFOL N/A 09/29/2018   Procedure: COLONOSCOPY WITH PROPOFOL;  Surgeon: Lollie Sails, MD;  Location: [facility] ENDOSCOPY;  Service: Endoscopy;  Laterality: N/A;   COLONOSCOPY WITH PROPOFOL N/A 04/15/2019   Procedure: COLONOSCOPY WITH PROPOFOL;  Surgeon: Lollie Sails, MD;  Location: [facility] ENDOSCOPY;  Service: Endoscopy;  Laterality: N/A;   CORONARY STENT INTERVENTION N/A 11/01/2021   Procedure: CORONARY STENT INTERVENTION;  Surgeon: Burnell Blanks, MD;  Location: [place] CV LAB;  Service: Cardiovascular;   Laterality: N/A;   LEFT HEART CATH AND CORONARY ANGIOGRAPHY N/A 10/30/2021   Procedure: LEFT HEART CATH AND CORONARY ANGIOGRAPHY;  Surgeon: Corey Skains, MD;  Location: [place] CV LAB;  Service: Cardiovascular;  Laterality: N/A;   WART FULGURATION N/A 06/10/2019   Procedure: FULGURATION ANAL WART;  Surgeon: Benjamine Sprague, DO;  Location: [facility] ORS;  Service: General;  Laterality: N/A;    Allergies  Allergies  Allergen Reactions   Sulfa Antibiotics Other (See Comments)    Unknown reaction    History of Present Illness    Juan [name] is a 81 y.o. male who presents via audio/video conferencing for a telehealth visit today.  Pt was last seen in cardiology clinic on 03/18/2022 by Dr. Angelena Form.  At that time Juan [name] was doing well.  The patient is now pending procedure as outlined above. Since his last visit, he has been doing well without exertional chest pain or worsening dyspnea.  He has had intermittent left shoulder pain, however this is clearly different from the previous angina which is more chest pain.   Home Medications    Prior to Admission medications   Medication Sig Start Date End Date Taking? Authorizing Provider  amLODipine (NORVASC) 5 MG tablet Take 1 tablet (5 mg total) by mouth daily. 11/03/21   Ledora Bottcher, PA  aspirin 81 MG EC tablet Take 1 tablet (81 mg total) by mouth daily. Swallow whole. 11/02/21   Cheryln Manly, NP  atorvastatin (LIPITOR) 80 MG tablet Take 1 tablet (80 mg total) by mouth daily. 11/03/21  Ledora Bottcher, PA  clopidogrel (PLAVIX) 75 MG tablet Take 1 tablet (75 mg total) by mouth daily. 11/03/21   Cheryln Manly, NP  ferrous gluconate (FERGON) 324 MG tablet TAKE 1 TABLET ('324MG'$  TOTAL) BY MOUTH DAILY WITH BREAKFAST 03/19/22   Tobb, Kardie, DO  nitroGLYCERIN (NITROSTAT) 0.4 MG SL tablet Place 1 tablet (0.4 mg total) under the tongue every 5 (five) minutes x 3 doses as needed for chest pain. 11/02/21   Cheryln Manly, NP   Vitamins-Lipotropics (LIPO FLAVONOID PLUS PO) Take by mouth 2 (two) times daily. 2 tabs 2 times daily    [provider]    Physical Exam    Vital Signs:  Juan [name] does not have vital signs available for review today.  Given telephonic nature of communication, physical exam is limited. AAOx3. NAD. Normal affect.  Speech and respirations are unlabored.  Accessory Clinical Findings    None  Assessment & Plan    1.  Preoperative Cardiovascular Risk Assessment:  -Patient has upcoming cochlear implantation surgery by Dr. Owens Shark of [facility] otolaryngology department.  He previously underwent stenting on 11/01/2021 with one drug-eluting stent to the LAD and 2 in the RCA.  Although typically we would recommend uninterrupted dual antiplatelet therapy for 12 months, however Dr. Angelena Form was okay with the patient start holding Plavix after 6 months in anticipation of cochlear implant procedure.  According to Dr. Camillia Herter note from 03/18/2022, "He can hold his Plavix after 05/04/22 if needed for his possible ear surgery."  Typical holding time of Plavix is 5 to 7 days.  Therefore, the earliest day he may proceed with surgery will be on 9/22 if he start holding Plavix on 9/17.  His current procedure however is scheduled for 9/20, I have made multiple attempts to call surgery coordinator and left message to see if surgery will need to be pushed back by a few days.  Given lack of chest discomfort, he is at acceptable risk to proceed.  We would prefer him to continue on aspirin but may hold Plavix for 5 to 7 days prior to the procedure after 9/16 and restart as soon as possible afterward at the surgeon's discretion.  A copy of this note will be routed to requesting surgeon.  Time:   Today, I have spent 17 minutes with the patient with telehealth technology discussing medical history, symptoms, and management plan.     [place], [place]  04/18/2022, 2:02 PM

## 2022-04-19 NOTE — Telephone Encounter (Signed)
Pt's son is calling back to get clarification on some information he received from PA, Almyra Deforest during pre-op visit for this surgery. Requesting call back.

## 2022-04-19 NOTE — Telephone Encounter (Signed)
I will forward to the pre op provider as pt's son has questions for pre op provider

## 2022-04-23 NOTE — Telephone Encounter (Signed)
Spoke with patient's son Cecilie Lowers who states he spoke with Almyra Deforest, PA on 9/1 and had no further questions. He thanked me for the call.

## 2022-05-09 ENCOUNTER — Other Ambulatory Visit: Payer: Self-pay | Admitting: Cardiology

## 2022-05-09 NOTE — Telephone Encounter (Signed)
Rx refill sent to pharmacy. 

## 2022-05-13 ENCOUNTER — Ambulatory Visit: Payer: Medicare Other | Admitting: Cardiology

## 2022-05-29 ENCOUNTER — Telehealth: Payer: Self-pay | Admitting: Cardiovascular Disease

## 2022-05-29 NOTE — Telephone Encounter (Signed)
New Message:     Daughter called. She is very concerned about patient. She said every since March off and on patient complains of hearing things that nobody else can hear. She does not not know if this could be coming from his medicine or what. She is just trying to figure out what might be going on and causing this.

## 2022-05-29 NOTE — Telephone Encounter (Signed)
I spoke with the patient's daughter.  She said he has been saying he is hearing things and cannot understand why they don't hear it too.  It has been going on several months.  It started shortly after his coronary intervention in March.  He went to PCP last week but it was not discussed there.  She has placed a call to them and is going to discuss with them, as well as move up his lab appointment and ask for his urine to be checked for possible UTI.  No needs from cardiology at this time.

## 2022-07-14 ENCOUNTER — Other Ambulatory Visit: Payer: Self-pay | Admitting: Cardiology

## 2022-08-19 ENCOUNTER — Other Ambulatory Visit: Payer: Self-pay | Admitting: Cardiology

## 2022-10-07 ENCOUNTER — Other Ambulatory Visit: Payer: Self-pay

## 2022-10-07 DIAGNOSIS — I10 Essential (primary) hypertension: Secondary | ICD-10-CM

## 2022-10-07 DIAGNOSIS — I214 Non-ST elevation (NSTEMI) myocardial infarction: Secondary | ICD-10-CM

## 2022-10-07 MED ORDER — AMLODIPINE BESYLATE 5 MG PO TABS: 5.0000 mg | ORAL_TABLET | Freq: Every day | ORAL | 3 refills | Status: DC

## 2022-10-07 MED ORDER — ATORVASTATIN CALCIUM 80 MG PO TABS: 80.0000 mg | ORAL_TABLET | Freq: Every day | ORAL | 3 refills | Status: DC

## 2022-11-01 ENCOUNTER — Other Ambulatory Visit: Payer: Self-pay | Admitting: Cardiology

## 2022-11-21 IMAGING — CT CT CERVICAL SPINE W/O CM
3 of 5 series · 11 of 33 positions shown, 13 images · non-contrast
Comparison: None.

CLINICAL DATA: Right otalgia. Chronic bilateral neck pain. Right
arm paresthesias.

EXAM:
CT CERVICAL SPINE WITHOUT CONTRAST
TECHNIQUE: Multidetector CT imaging of the cervical spine was performed without
intravenous contrast. Multiplanar CT image reconstructions were also
generated.

[Series 2: axial (person_name) 2.00 ax · axial · 0.32mm/px · z∈[-721,-613]mm · 3 of 127 slices shown, 4 images]
[im 32/127  soft-tissue]
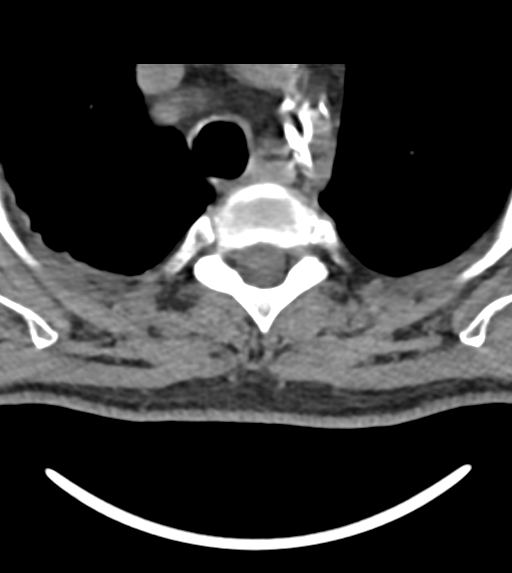
[im 32/127  bone]
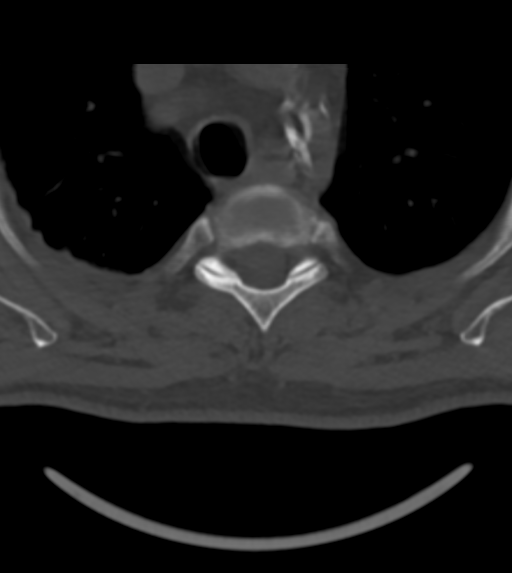
[im 64/127  bone]
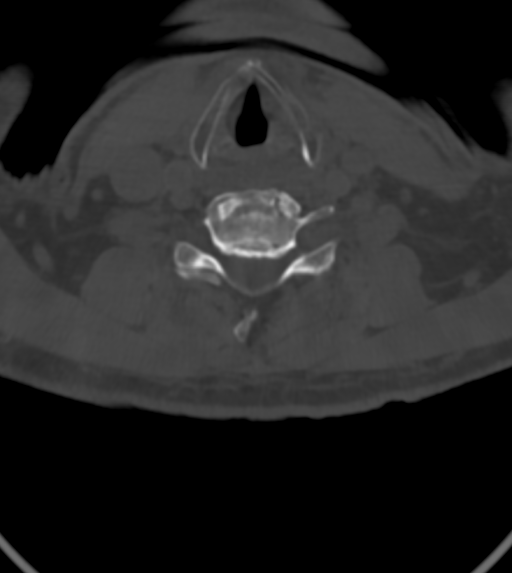
[im 95/127  bone]
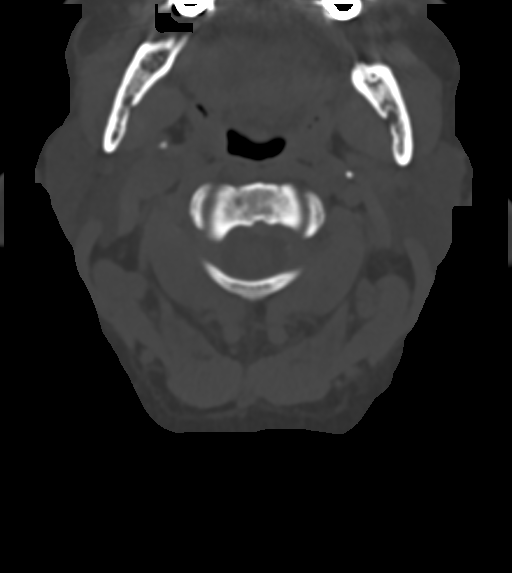

[Series 6: sag bone c-spine 2.00 sag · sagittal · 0.36mm/px · 5 of 82 slices shown, 6 images]
[im 28/82  bone]
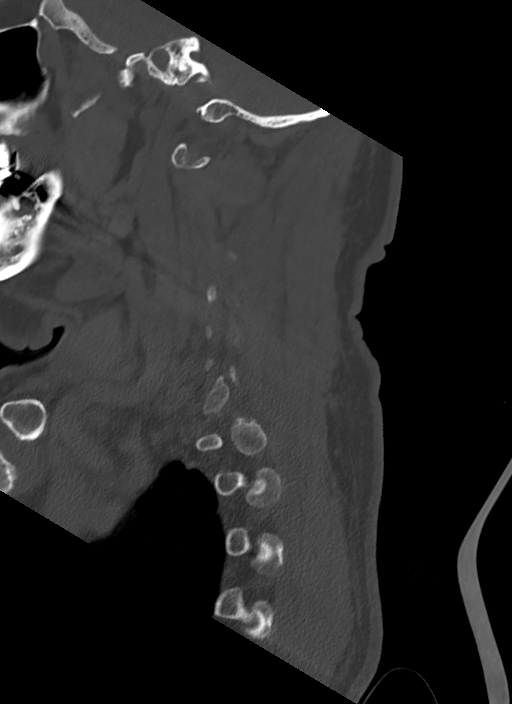
[im 34/82  bone]
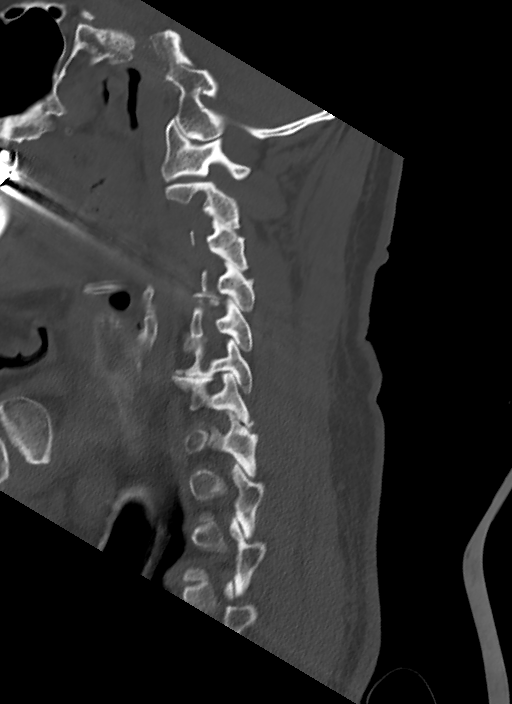
[im 41/82  soft-tissue]
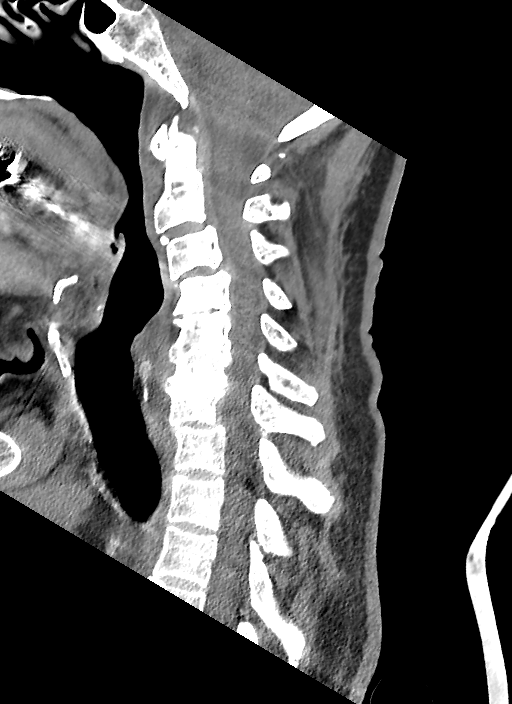
[im 41/82  bone]
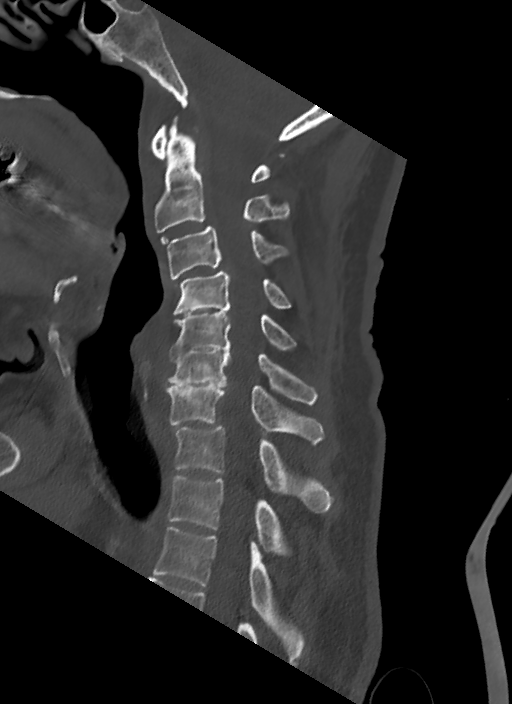
[im 48/82  bone]
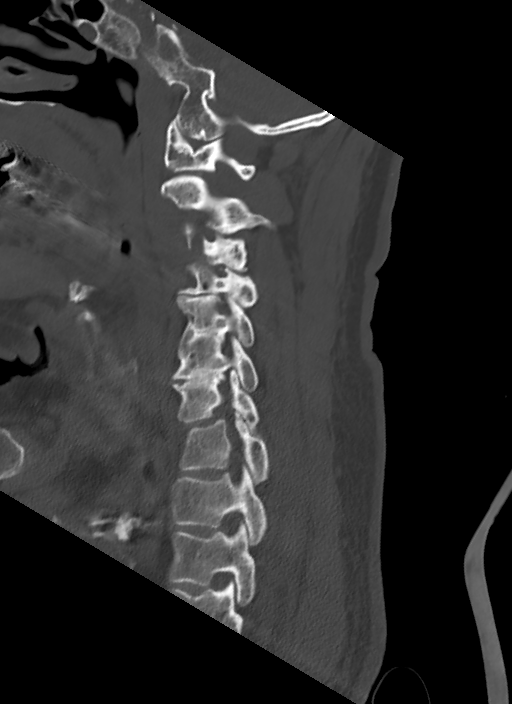
[im 55/82  bone]
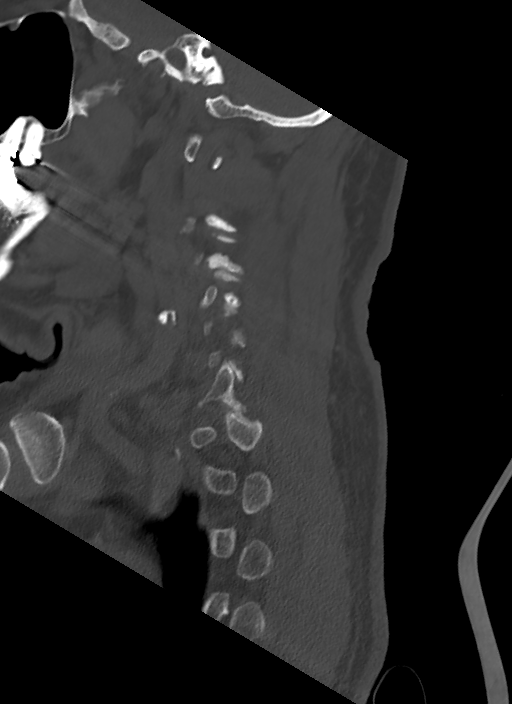

[Series 8: cor bone c-spine 2.00 cor · coronal · 0.32mm/px · 3 of 92 slices shown]
[im 32/92  bone]
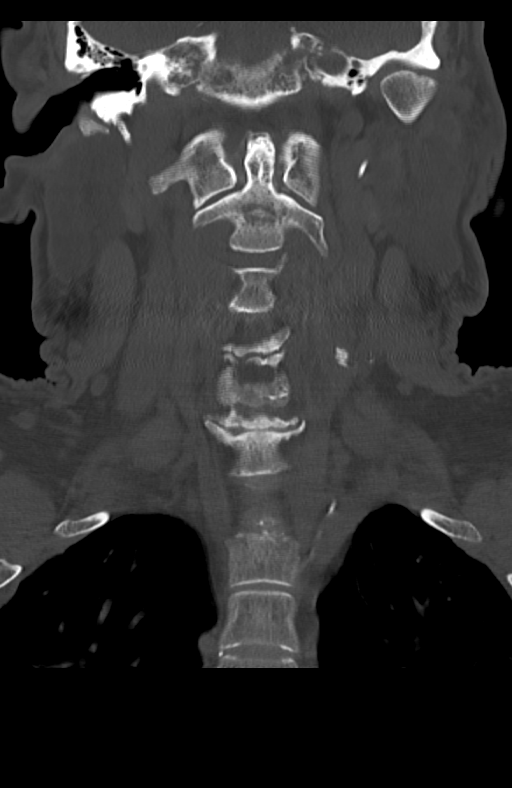
[im 41/92  bone]
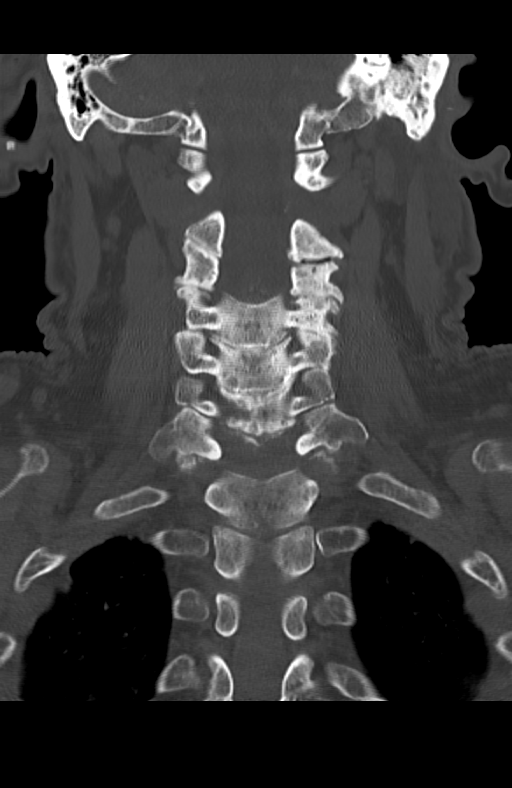
[im 51/92  bone]
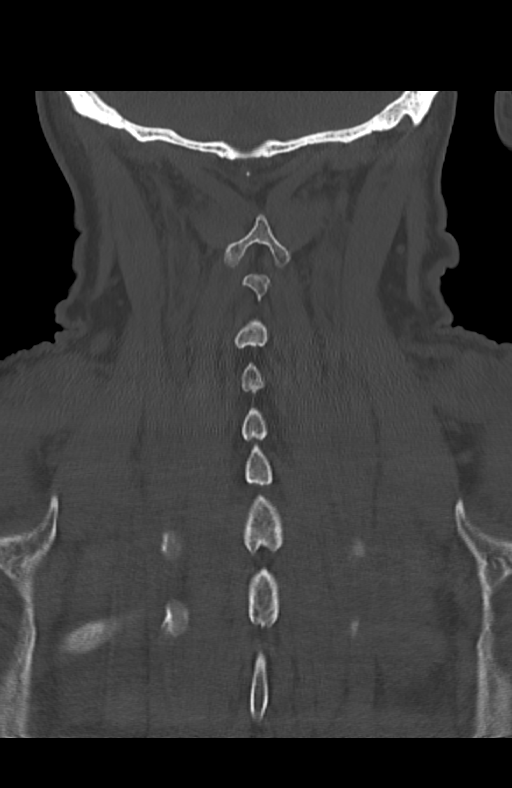

[11 of 33 positions shown; findings below may reference images not displayed]

FINDINGS: Alignment: Reversal of the normal cervical lordosis. Grade 1
anterolisthesis of C2 on C3, C3 on C4, and C7 on T1.

Skull base and vertebrae: No acute fracture or destructive osseous
process. Moderate median C1-2 arthropathy.

Soft tissues and spinal canal: No prevertebral fluid or swelling. No
visible canal hematoma.

Disc levels: Advanced disc degeneration from C4-5 to C6-7 with
fusion across the disc spaces at C5-6 and C6-7. Moderate disc space
narrowing at C3-4 and C7-T1.

C2-3: Anterolisthesis with bulging uncovered disc, uncovertebral
spurring, and mild right and severe left facet arthrosis result in
mild spinal stenosis and mild left neural foraminal stenosis.

C3-4: Anterolisthesis with bulging uncovered disc, uncovertebral
spurring, and moderate right and severe left facet arthrosis result
in mild spinal stenosis and mild right and severe left neural
foraminal stenosis.

C4-5: Broad-based posterior disc osteophyte complex and mild left
facet arthrosis result in mild spinal stenosis and moderate
bilateral neural foraminal stenosis.

C5-6: Diffuse osteophytic ridging results in borderline spinal
stenosis and mild-to-moderate bilateral neural foraminal stenosis.

C6-7: Diffuse osteophytic ridging results in borderline spinal
stenosis and mild right and mild-to-moderate left neural foraminal
stenosis.

C7-T1: Anterolisthesis and severe facet arthrosis without evidence
of significant stenosis.

Upper chest: Clear lung apices.

Other: Calcific atherosclerosis at the left greater than right
carotid bifurcations.
IMPRESSION: 1. Advanced cervical disc and facet degeneration with mild
multilevel spinal stenosis as above.
2. Severe left neural foraminal stenosis at C3-4.
3. Moderate bilateral neural foraminal stenosis at C4-5.

## 2022-12-17 ENCOUNTER — Telehealth: Payer: Self-pay | Admitting: Cardiovascular Disease

## 2022-12-17 NOTE — Telephone Encounter (Signed)
According to last note from Dr. Clifton Cheikh on 03/18/22:  Assessment and Plan:    1. CAD without angina: Doing well post PCI in March 2023. Will plan to continue DAPT with ASA and Plavix for one year post PCI. He can hold his Plavix after 05/04/22 if needed for his possible ear surgery. Continue statin.   __________________________________________________________   Algis Downs the patient's daughter that it is okay per the notes to stop the Plavix at this point.   He continues to have auditory hallucinations and she said he is very sensitive to medications and since Dr. Clifton Vijay said he could stop this after a year she really hopes it may make a difference for him.   Will continue aspirin.

## 2022-12-17 NOTE — Telephone Encounter (Signed)
Pt c/o medication issue:  1. Name of Medication:   clopidogrel (PLAVIX) 75 MG tablet    2. How are you currently taking this medication (dosage and times per day)? As prescribed  3. Are you having a reaction (difficulty breathing--STAT)?   4. What is your medication issue? Patient's daughter is requesting call back to discuss clarification on when and if the patient is to stop taking this medication.

## 2023-01-28 ENCOUNTER — Ambulatory Visit: Payer: Medicare Other | Admitting: Nurse Practitioner

## 2023-01-30 ENCOUNTER — Ambulatory Visit: Payer: Medicare Other | Admitting: Student

## 2023-02-19 ENCOUNTER — Ambulatory Visit: Payer: Medicare Other | Attending: Student | Admitting: Nurse Practitioner

## 2023-02-19 ENCOUNTER — Encounter: Payer: Self-pay | Admitting: Nurse Practitioner

## 2023-02-19 ENCOUNTER — Other Ambulatory Visit: Payer: Self-pay

## 2023-02-19 VITALS — BP 118/64 | HR 56 | Ht 65.0 in | Wt 142.6 lb

## 2023-02-19 DIAGNOSIS — I10 Essential (primary) hypertension: Secondary | ICD-10-CM

## 2023-02-19 DIAGNOSIS — N1831 Chronic kidney disease, stage 3a: Secondary | ICD-10-CM | POA: Diagnosis not present

## 2023-02-19 DIAGNOSIS — I2510 Atherosclerotic heart disease of native coronary artery without angina pectoris: Secondary | ICD-10-CM

## 2023-02-19 DIAGNOSIS — E785 Hyperlipidemia, unspecified: Secondary | ICD-10-CM

## 2023-02-19 DIAGNOSIS — R440 Auditory hallucinations: Secondary | ICD-10-CM

## 2023-02-19 DIAGNOSIS — Z79899 Other long term (current) drug therapy: Secondary | ICD-10-CM

## 2023-02-19 MED ORDER — VALSARTAN 80 MG PO TABS: 80.0000 mg | ORAL_TABLET | Freq: Every day | ORAL | 3 refills | Status: DC

## 2023-02-19 NOTE — Patient Instructions (Signed)
Medication Instructions:  Stop Amlodipine as directed Start Valsartan 80 mg daily  *If you need a refill on your cardiac medications before your next appointment, please call your pharmacy*   Lab Work: Your physician recommends that you return for lab work in 2 weeks. BMET   Testing/Procedures: NONE ordered at this time of appointment     Follow-Up: At [facility], you and your health needs are our priority.  As part of our continuing mission to provide you with exceptional heart care, we have created designated Provider Care Teams.  These Care Teams include your primary Cardiologist (physician) and Advanced Practice Providers (APPs -  Physician Assistants and Nurse Practitioners) who all work together to provide you with the care you need, when you need it.  We recommend signing up for the patient portal called "MyChart".  Sign up information is provided on this After Visit Summary.  MyChart is used to connect with patients for Virtual Visits (Telemedicine).  Patients are able to view lab/test results, encounter notes, upcoming appointments, etc.  Non-urgent messages can be sent to your provider as well.   To learn more about what you can do with MyChart, go to [email or web address].    Your next appointment:   3 month(s)  Provider:   Verne Carrow, MD or Any APP [facility])    Other Instructions

## 2023-02-19 NOTE — Progress Notes (Signed)
Office Visit    Patient Name: Juan [name] Date of Encounter: 02/19/2023  Primary Care Provider:  Dorothey Baseman, MD Primary Cardiologist:  Verne Carrow, MD  Chief Complaint    82 year old male with a history of CAD s/p PCI/DES-LAD, DES x 2-RCA in 10/2021, hypertension, hyperlipidemia, CKD, and arthritis who presents for follow-up related to CAD.  Past Medical History    Past Medical History:  Diagnosis Date   Arthritis    Cataracts, both eyes    Hearing loss    left ear   History of kidney stones    Hypertension    Past Surgical History:  Procedure Laterality Date   APPENDECTOMY     COLONOSCOPY WITH PROPOFOL N/A 09/29/2018   Procedure: COLONOSCOPY WITH PROPOFOL;  Surgeon: Christena Deem, MD;  Location: [facility] ENDOSCOPY;  Service: Endoscopy;  Laterality: N/A;   COLONOSCOPY WITH PROPOFOL N/A 04/15/2019   Procedure: COLONOSCOPY WITH PROPOFOL;  Surgeon: Christena Deem, MD;  Location: [facility] ENDOSCOPY;  Service: Endoscopy;  Laterality: N/A;   CORONARY STENT INTERVENTION N/A 11/01/2021   Procedure: CORONARY STENT INTERVENTION;  Surgeon: Kathleene Hazel, MD;  Location: [facility] INVASIVE CV LAB;  Service: Cardiovascular;  Laterality: N/A;   LEFT HEART CATH AND CORONARY ANGIOGRAPHY N/A 10/30/2021   Procedure: LEFT HEART CATH AND CORONARY ANGIOGRAPHY;  Surgeon: Lamar Blinks, MD;  Location: [facility] INVASIVE CV LAB;  Service: Cardiovascular;  Laterality: N/A;   WART FULGURATION N/A 06/10/2019   Procedure: FULGURATION ANAL WART;  Surgeon: Sung Amabile, DO;  Location: [facility] ORS;  Service: General;  Laterality: N/A;    Allergies  Allergies  Allergen Reactions   Sulfa Antibiotics Other (See Comments)    Unknown reaction     Labs/Other Studies Reviewed    The following studies were reviewed today:  Cardiac Studies & Procedures   CARDIAC CATHETERIZATION  CARDIAC CATHETERIZATION 11/01/2021  Narrative   Prox RCA lesion is 75% stenosed.   Mid RCA lesion is 95%  stenosed.   Prox RCA to Mid RCA lesion is 50% stenosed.   Prox LAD to Mid LAD lesion is 99% stenosed.   1st Diag lesion is 99% stenosed.   A drug-eluting stent was successfully placed using a STENT ONYX FRONTIER 3.5X12.   A drug-eluting stent was successfully placed using a STENT ONYX FRONTIER 3.0X18.   A drug-eluting stent was successfully placed using a STENT ONYX FRONTIER [id number].   Post intervention, there is a 0% residual stenosis.   Post intervention, there is a 0% residual stenosis.   Post intervention, there is a 0% residual stenosis.  Severe stenosis mid LAD involving a Diagonal branch. The LAD was successful treated with PTCA with placement of a drug eluting stent. I was unable to wire the Diagonal branch. The Diagonal branch was occluded following LAD stent placement.  Severe stenosis proximal and mid RCA. Successful PTCA/DES x 1 proximal RCA. Successful PTCA/DES x 1 mid RCA.  Findings Coronary Findings Diagnostic  Dominance: Right  Left Anterior Descending Prox LAD to Mid LAD lesion is 99% stenosed.  First Diagonal Branch 1st Diag lesion is 99% stenosed.  Right Coronary Artery Prox RCA lesion is 75% stenosed. Prox RCA to Mid RCA lesion is 50% stenosed. Mid RCA lesion is 95% stenosed.  Intervention  Prox LAD to Mid LAD lesion Stent CATH [place]IST[place] GUIDE 6FR XBLAD3.5 guide catheter was inserted. Lesion crossed with guidewire using a WIRE HI TORQ WHISPER MS 190CM. Pre-stent angioplasty was performed using a BALLN SAPPHIRE 2.0X12. A drug-eluting stent  was successfully placed using a STENT ONYX FRONTIER [id number]. Post-stent angioplasty was performed using a BALLN SAPPHIRE [place] 3.0X8. Post-Intervention Lesion Assessment The intervention was successful. Pre-interventional TIMI flow is 3. Post-intervention TIMI flow is 3. No complications occurred at this lesion. There is a 0% residual stenosis post intervention.  Prox RCA lesion Stent CATH LAUNCHER 6FR AL.75 guide catheter  was inserted. Lesion crossed with guidewire using a WIRE COUGAR XT STRL 190CM. Pre-stent angioplasty was performed using a BALLN SAPPHIRE 2.0X12. A drug-eluting stent was successfully placed using a STENT ONYX FRONTIER 3.5X12. Stent strut is well apposed. Post-stent angioplasty was performed using a BALLN [place] EUPHORA RX 3.75X8. Post-Intervention Lesion Assessment The intervention was successful. Pre-interventional TIMI flow is 3. Post-intervention TIMI flow is 3. No complications occurred at this lesion. There is a 0% residual stenosis post intervention.  Mid RCA lesion Stent CATH LAUNCHER 6FR AL.75 guide catheter was inserted. Lesion crossed with guidewire using a WIRE COUGAR XT STRL 190CM. Pre-stent angioplasty was performed using a BALLN SAPPHIRE 2.0X12. A drug-eluting stent was successfully placed using a STENT ONYX FRONTIER 3.0X18. Post-stent angioplasty was performed using a BALLN SAPPHIRE [place] 3.5X12. Post-Intervention Lesion Assessment The intervention was successful. Pre-interventional TIMI flow is 3. Post-intervention TIMI flow is 3. No complications occurred at this lesion. There is a 0% residual stenosis post intervention.   CARDIAC CATHETERIZATION  CARDIAC CATHETERIZATION 10/30/2021  Narrative   Prox RCA lesion is 75% stenosed.   Mid RCA lesion is 95% stenosed.   Prox RCA to Mid RCA lesion is 60% stenosed.   Prox LAD lesion is 95% stenosed with 95% stenosed side branch in 1st Sept.   LV end diastolic pressure is normal.  82 year old male with hypertension hyperlipidemia with progressive episodes of chest discomfort consistent with unstable angina with an abnormal stress test consistent with anterior myocardial ischemia  Echocardiogram showing normal LV systolic function with ejection fraction of 60% and no evidence of significant valvular heart disease  Left anterior descending artery having a 95% proximal lesion and a large diagonal branch Right coronary artery with multiple  stenoses at 75% proximal and 95% mid  Plan Further consultation with cardiovascular surgery for coronary bypass grafting due to tortuosity of his blood vessels and a significant bifurcating stenoses High intensity cholesterol therapy Hypertension control Cardiac rehabilitation  Findings Coronary Findings Diagnostic  Dominance: Right  Left Anterior Descending Prox LAD lesion is 95% stenosed with 95% stenosed side branch in 1st Sept.  Right Coronary Artery Prox RCA lesion is 75% stenosed. Prox RCA to Mid RCA lesion is 60% stenosed. Mid RCA lesion is 95% stenosed.  Intervention  No interventions have been documented.   STRESS TESTS  NM MYOCAR MULTI W/SPECT W 10/30/2021  Narrative   Findings are consistent with ischemia. The study is high risk.   No ST deviation was noted.   LV perfusion is abnormal. There is evidence of ischemia. Defect 1: There is a medium defect with moderate reduction in uptake present in the apical to mid anterior, anterolateral and anteroseptal location(s) that is reversible. There is normal wall motion in the defect area. Consistent with ischemia.   Left ventricular function is normal. End diastolic cavity size is normal.   Prior study not available for comparison.   ECHOCARDIOGRAM  ECHOCARDIOGRAM COMPLETE 10/28/2021  Narrative ECHOCARDIOGRAM REPORT    Patient Name:   Juan [name] Date of Exam: 10/28/2021 Medical Rec #:  [id number]       Height:       65.0  in Accession #:    [id number]      Weight:       154.3 lb Date of Birth:  30-Jan-1941       BSA:          1.772 m Patient Age:    80 years        BP:           129/69 mmHg Patient Gender: M               HR:           49 bpm. Exam Location:  [facility]  Procedure: 2D Echo  Indications:     Chest Pain R07.9 Elevated Troponin  History:         Patient has no prior history of Echocardiogram examinations.  Sonographer:     Overton Mam RDCS Referring Phys:  [id number] AMY N COX Diagnosing Phys:  Sena Slate  IMPRESSIONS   1. Left ventricular ejection fraction, by estimation, is 60 to 65%. The left ventricle has normal function. The left ventricle has no regional wall motion abnormalities. Left ventricular diastolic parameters were normal. 2. Right ventricular systolic function is normal. The right ventricular size is normal. 3. The mitral valve is normal in structure. Mild mitral valve regurgitation. No evidence of mitral stenosis. 4. The aortic valve is normal in structure. Aortic valve regurgitation is trivial. No aortic stenosis is present. 5. The inferior vena cava is normal in size with greater than 50% respiratory variability, suggesting right atrial pressure of 3 mmHg.  FINDINGS Left Ventricle: Left ventricular ejection fraction, by estimation, is 60 to 65%. The left ventricle has normal function. The left ventricle has no regional wall motion abnormalities. The left ventricular internal cavity size was normal in size. There is no left ventricular hypertrophy. Left ventricular diastolic parameters were normal.  Right Ventricle: The right ventricular size is normal. No increase in right ventricular wall thickness. Right ventricular systolic function is normal.  Left Atrium: Left atrial size was normal in size.  Right Atrium: Right atrial size was normal in size.  Pericardium: There is no evidence of pericardial effusion.  Mitral Valve: The mitral valve is normal in structure. Mild mitral valve regurgitation. No evidence of mitral valve stenosis.  Tricuspid Valve: The tricuspid valve is normal in structure. Tricuspid valve regurgitation is trivial. No evidence of tricuspid stenosis.  Aortic Valve: The aortic valve is normal in structure. Aortic valve regurgitation is trivial. No aortic stenosis is present. Aortic valve peak gradient measures 7.5 mmHg.  Pulmonic Valve: The pulmonic valve was not well visualized. Pulmonic valve regurgitation is mild. No evidence of pulmonic  stenosis.  Aorta: The aortic root is normal in size and structure.  Venous: The inferior vena cava is normal in size with greater than 50% respiratory variability, suggesting right atrial pressure of 3 mmHg.  IAS/Shunts: No atrial level shunt detected by color flow Doppler.   LEFT VENTRICLE PLAX 2D LVIDd:         4.74 cm Diastology LVIDs:         3.10 cm LV e' medial:    8.27 cm/s LV PW:         0.96 cm LV E/e' medial:  10.4 LV IVS:        0.93 cm LV e' lateral:   9.57 cm/s LV E/e' lateral: 9.0   RIGHT VENTRICLE RV Basal diam:  3.00 cm RV S prime:     14.40 cm/s TAPSE (M-mode): 1.9 cm  LEFT ATRIUM           Index        RIGHT ATRIUM           Index LA diam:      3.10 cm 1.75 cm/m   RA Area:     13.90 cm LA Vol (A2C): 53.8 ml 30.37 ml/m  RA Volume:   34.50 ml  19.47 ml/m LA Vol (A4C): 56.3 ml 31.78 ml/m AORTIC VALVE              PULMONIC VALVE AV Vmax:      137.09 cm/s PV Vmax:          1.23 m/s AV Peak Grad: 7.5 mmHg    PV Peak grad:     6.1 mmHg LVOT Vmax:    110.00 cm/s PR End Diast Vel: 2.31 msec LVOT Vmean:   76.600 cm/s LVOT VTI:     0.252 m  AORTA Ao Root diam: 2.80 cm Ao Asc diam:  2.90 cm  MITRAL VALVE               TRICUSPID VALVE MV Area (PHT): 3.65 cm    TV Peak grad:   24.0 mmHg MV Decel Time: 208 msec    TV Vmax:        2.45 m/s MV E velocity: 85.70 cm/s MV A velocity: 56.10 cm/s  SHUNTS MV E/A ratio:  1.53        Systemic VTI: 0.25 m  Sena Slate Electronically signed by Sena Slate Signature Date/Time: 10/28/2021/12:40:59 PM    Final            Recent Labs: No results found for requested labs within last 365 days.  Recent Lipid Panel    Component Value Date/Time   CHOL 208 (H) 10/27/2021 0514   TRIG 63 10/27/2021 0514   HDL 71 10/27/2021 0514   CHOLHDL 2.9 10/27/2021 0514   VLDL 13 10/27/2021 0514   LDLCALC 124 (H) 10/27/2021 0514    History of Present Illness    82 year old male with with the above past medical history including  CAD s/p PCI/DES-LAD, DES x 2-RCA in 10/2021, hypertension, hyperlipidemia, CKD, and arthritis.  He was previously seen at the [facility] cardiology clinic in [facility] in the setting of chest pain.  He had an abnormal nuclear stress test.  Cardiac catheterization at [facility] in March 2023 revealed severe LAD stenosis, severe RCA stenosis.  He was transferred to [facility] for consideration of CABG but anatomy was felt not to be suitable for CABG.  He ultimately underwent PCI with placement of DES-LAD and 2 drug-eluting stents in the RCA on 11/01/2021.  Echocardiogram at the time showed EF 60 to 65%, mild MR.  He was last seen in the office on 03/18/2022 and was doing well from a cardiac standpoint.  He denied symptoms concerning for angina. He was seen virtually on 04/18/2022 for preoperative cardiac evaluation for cochlear implant.  He contacted our office in April 2024 with concern for auditory hallucinations that have been ongoing since the time of his stent procedure.  He requested to discontinue Plavix to see if this improved his symptoms.  He presents today for follow-up accompanied by his daughter.  Since his last visit he has been stable from a cardiac standpoint though he continues daily auditory hallucinations spite discontinuing Plavix.  He thinks that a man from [place] is trying to kill him.  He denies symptoms concerning for angina.  He has seen  neurology who started him on Risperdal however after taking the medication for 3 days he felt that he was "going to have a heart attack" and he stopped taking the medication.  Other than his ongoing auditory hallucinations, he denies any additional concerns today.  Home Medications    Current Outpatient Medications  Medication Sig Dispense Refill   amLODipine (NORVASC) 5 MG tablet Take 1 tablet (5 mg total) by mouth daily. 90 tablet 3   ASPIRIN LOW DOSE 81 MG tablet TAKE 1 TABLET (81MG  TOTAL) BY MOUTH DAILY SWALLOW WHOLE 90 tablet 1   atorvastatin  (LIPITOR) 80 MG tablet Take 1 tablet (80 mg total) by mouth daily. 90 tablet 3   clopidogrel (PLAVIX) 75 MG tablet TAKE 1 TABLET (75MG  TOTAL) BY MOUTH DAILY 90 tablet 2   ferrous gluconate (FERGON) 324 MG tablet TAKE 1 TABLET BY MOUTH DAILY WITH BREAKFAST 46 tablet 3   nitroGLYCERIN (NITROSTAT) 0.4 MG SL tablet Place 1 tablet (0.4 mg total) under the tongue every 5 (five) minutes x 3 doses as needed for chest pain. 25 tablet 1   tamsulosin (FLOMAX) 0.4 MG CAPS capsule Take 0.4 mg by mouth daily.     Vitamins-Lipotropics (LIPO FLAVONOID PLUS PO) Take by mouth 2 (two) times daily. 2 tabs 2 times daily     No current facility-administered medications for this visit.     Review of Systems    He denies chest pain, palpitations, dyspnea, pnd, orthopnea, n, v, dizziness, syncope, edema, weight gain, or early satiety. All other systems reviewed and are otherwise negative except as noted above.   Physical Exam    VS:  BP 118/64 (BP Location: Left Arm, Patient Position: Sitting, Cuff Size: Normal)   Pulse (!) 56   Ht 5\' 5"  (1.651 m)   Wt 142 lb 9.6 oz (64.7 kg)   SpO2 97%   BMI 23.73 kg/m  GEN: Well nourished, well developed, in no acute distress. HEENT: normal. Neck: Supple, no JVD, carotid bruits, or masses. Cardiac: RRR, no murmurs, rubs, or gallops. No clubbing, cyanosis, edema.  Radials/DP/PT 2+ and equal bilaterally.  Respiratory:  Respirations regular and unlabored, clear to auscultation bilaterally. GI: Soft, nontender, nondistended, BS + x 4. MS: no deformity or atrophy. Skin: warm and dry, no rash. Neuro:  Strength and sensation are intact. Psych: Normal affect.  Accessory Clinical Findings    ECG personally reviewed by me today - EKG Interpretation Date/Time:  Wednesday February 19 2023 15:31:31 EDT Ventricular Rate:  56 PR Interval:  166 QRS Duration:  88 QT Interval:  430 QTC Calculation: 414 R Axis:   62  Text Interpretation: Sinus bradycardia with sinus arrhythmia When  compared with ECG of 02-Nov-2021 05:17, No significant change was found Confirmed by Bernadene Person ([id number]) on 02/19/2023 3:35:00 PM  - no acute changes.   Lab Results  Component Value Date   WBC 6.8 11/08/2021   HGB 10.7 (L) 11/08/2021   HCT 33.1 (L) 11/08/2021   MCV 90 11/08/2021   PLT 230 11/08/2021   Lab Results  Component Value Date   CREATININE 1.55 (H) 11/02/2021   BUN 22 11/02/2021   NA 137 11/02/2021   K 4.0 11/02/2021   CL 106 11/02/2021   CO2 22 11/02/2021   Lab Results  Component Value Date   ALT 44 11/01/2021   AST 31 11/01/2021   ALKPHOS 33 (L) 11/01/2021   BILITOT 0.4 11/01/2021   Lab Results  Component Value Date   CHOL 208 (H)  10/27/2021   HDL 71 10/27/2021   LDLCALC 124 (H) 10/27/2021   TRIG 63 10/27/2021   CHOLHDL 2.9 10/27/2021    No results found for: "HGBA1C"  Assessment & Plan    1. CAD: S/p PCI/DES-LAD, DES x 2-RCA in 10/2021. Stable with no anginal symptoms. No indication for ischemic evaluation.  Continue aspirin, Plavix, amlodipine, Lipitor.  2. Hypertension: BP well controlled.  Will stop amlodipine and start valsartan as below.  3. Hyperlipidemia: LDL was 65 in 05/2022. Monitored and managed per PCP.  Continue Lipitor.  4. CKD stage IIIa: Creatinine was stable at 1.5 in 05/2022.   5.  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  He has daily auditory hallucinations, tinnitus, and insomnia. He thinks that a man from [place] is trying to kill him. Per patient and patient's daughter this has been ongoing since the time of his stent placement in March 2023.  He has seen neurology and audiology.  He did not tolerate Risperdal.  He denies any family history of schizophrenia.  Question whether or not this could be related to hearing loss/cochlear implant.  In reviewing his medication list, he is get nia, and tinnitus as a possible side effect.  Will trial discontinuation of amlodipine to see if his symptoms improve.  Will start valsartan  80 mg daily for BP control.  Will check BMET in 2 weeks.  Recommend follow-up with neurology/audiology.  5. Disposition: Follow-up in 6 months.      Joylene Grapes, NP 02/19/2023, 3:35 PM

## 2023-02-21 ENCOUNTER — Telehealth: Payer: Self-pay | Admitting: Cardiovascular Disease

## 2023-02-21 NOTE — Telephone Encounter (Signed)
Pt c/o medication issue:  1. Name of Medication:   valsartan (DIOVAN) 80 MG tablet   2. How are you currently taking this medication (dosage and times per day)?   As prescribed  3. Are you having a reaction (difficulty breathing--STAT)?   4. What is your medication issue?   Daughter stated patient's BP dropped really low on this medication and she has stopped it.  Daughter stated patient's BP dropped 114/52 yesterday.

## 2023-02-21 NOTE — Telephone Encounter (Signed)
Called patient's dtr Juan [name] back. Juan [name] states that patient was seen by Juliane Lack NP on 02/19/23 and at that time his amlodipine was stopped and valsartan was started. This was changed as he has been experiencing auditory hallucinations since his stent placement and family was concerned this was due to medication interactions. Dtr Juan [name] (DPR) states taht yesterday her father was complaining of dizziness and when she checked his BP it was 116/52. When she checked it later it was 120/70 and she felt these readings were too low so she states she has discontinued the medication.   Reviewed w/ dtr that when patient was seen on 02/19/23 BP was 118/64, also reviewed risks of rebound hypertension. Dtr states she is not inclined to restart med under any circumstances at this time. She states the change was made due to patient's auditory hallucinations and there has been no improvement in this symptom. Dtr requests we pass this on to E. Monge for her advice. Advised Dtr Juan [name] to track HR/BP/symptoms over the weekend and call us on Monday. Juan [name] verbalizes understanding and agrees to plan.

## 2023-02-21 NOTE — Telephone Encounter (Signed)
Called patient and dtr to discuss E. Monge's recommendations, no answer. Left message per DPR asking patient or dtr to call back to discuss BP recommendations.

## 2023-02-27 NOTE — Telephone Encounter (Signed)
Left a message for the pt/ daughter to call back to update BP meds.

## 2023-03-06 ENCOUNTER — Telehealth: Payer: Self-pay | Admitting: Nurse Practitioner

## 2023-03-06 NOTE — Telephone Encounter (Signed)
On last visit 7/3 patient was taken off amlodipine to see if it was the cause of his auditory hallucinations. Son states patient is still hearing voices and think they are threatening him. Son does not feel patient is in a crisis or is a harm to himself or others. He states audiology stated its not from the cochlear implants.   Did advise to contact pcp because he may need to be seen by behavioral health.

## 2023-03-06 NOTE — Telephone Encounter (Signed)
New Message:     Patient;'s son called. He would like for a nurse to please give him a call. He said patient is hearing people talked to him and he says they are threatening him.  Son says he think he has some blockage> He wants to know if would order a MRI or CT please?

## 2023-03-06 NOTE — Telephone Encounter (Signed)
Left voicemail to return call office

## 2023-03-06 NOTE — Telephone Encounter (Signed)
Left voicemail to return call to office.

## 2023-03-07 NOTE — Telephone Encounter (Addendum)
Son is aware patient needs to be seen by behavior health provider. Did advise to contact pcp if patient will need referral. Did give information for [facility]. Son will give them a call to see if he needs referral. Are we able to place order

## 2023-03-26 ENCOUNTER — Other Ambulatory Visit: Payer: Self-pay | Admitting: Cardiology

## 2023-03-31 ENCOUNTER — Telehealth: Payer: Self-pay | Admitting: Cardiovascular Disease

## 2023-03-31 ENCOUNTER — Encounter: Payer: Self-pay | Admitting: Cardiovascular Disease

## 2023-03-31 NOTE — Telephone Encounter (Signed)
-----   Message from Georgie Chard sent at 02/21/2023  8:59 AM EDT ----- Regarding: follow up with Dr. Clifton Banks This patient was seen in clinic 7/3 with plans for him to follow with Dr. Clifton Nole in 3 months. This was inadvertently sent to the Structural Heart team. He will need general cardiology follow up with Dr. Clifton Hobart or APP.   Thank you  Noreene Larsson

## 2023-03-31 NOTE — Telephone Encounter (Signed)
Patient contacted 3x with no success in scheduling. Will send letter.

## 2023-04-16 ENCOUNTER — Other Ambulatory Visit: Payer: Self-pay | Admitting: Cardiovascular Disease

## 2023-04-23 ENCOUNTER — Other Ambulatory Visit: Payer: Self-pay | Admitting: Family Medicine

## 2023-04-23 DIAGNOSIS — R443 Hallucinations, unspecified: Secondary | ICD-10-CM

## 2023-04-23 DIAGNOSIS — R4189 Other symptoms and signs involving cognitive functions and awareness: Secondary | ICD-10-CM

## 2023-04-23 DIAGNOSIS — R419 Unspecified symptoms and signs involving cognitive functions and awareness: Secondary | ICD-10-CM

## 2023-04-24 DIAGNOSIS — F22 Delusional disorders: Secondary | ICD-10-CM | POA: Insufficient documentation

## 2023-04-24 DIAGNOSIS — Z860101 Personal history of adenomatous and serrated colon polyps: Secondary | ICD-10-CM | POA: Insufficient documentation

## 2023-04-25 NOTE — Telephone Encounter (Signed)
Called daughter back, no answer. Left message asking her to call our office to discuss patient's bp meds. Letter sent.

## 2023-05-27 ENCOUNTER — Encounter: Payer: Self-pay | Admitting: Family Medicine

## 2023-05-27 ENCOUNTER — Other Ambulatory Visit: Payer: Self-pay | Admitting: Family Medicine

## 2023-05-27 DIAGNOSIS — R4189 Other symptoms and signs involving cognitive functions and awareness: Secondary | ICD-10-CM

## 2023-05-27 DIAGNOSIS — R419 Unspecified symptoms and signs involving cognitive functions and awareness: Secondary | ICD-10-CM

## 2023-05-27 DIAGNOSIS — R443 Hallucinations, unspecified: Secondary | ICD-10-CM

## 2023-06-06 ENCOUNTER — Ambulatory Visit
Admission: RE | Admit: 2023-06-06 | Discharge: 2023-06-06 | Disposition: A | Discharge status: Home / Self Care | Payer: Medicare Other | Source: Ambulatory Visit | Attending: Family Medicine | Admitting: Family Medicine

## 2023-06-06 DIAGNOSIS — R443 Hallucinations, unspecified: Secondary | ICD-10-CM | POA: Diagnosis present

## 2023-06-06 DIAGNOSIS — R419 Unspecified symptoms and signs involving cognitive functions and awareness: Secondary | ICD-10-CM | POA: Diagnosis present

## 2023-06-06 DIAGNOSIS — R4189 Other symptoms and signs involving cognitive functions and awareness: Secondary | ICD-10-CM | POA: Insufficient documentation

## 2023-08-09 ENCOUNTER — Other Ambulatory Visit: Payer: Self-pay | Admitting: Cardiology

## 2023-09-08 ENCOUNTER — Telehealth: Payer: Self-pay | Admitting: Cardiovascular Disease

## 2023-09-08 NOTE — Telephone Encounter (Signed)
   Pt c/o of Chest Pain: STAT if active CP, including tightness, pressure, jaw pain, radiating pain to shoulder/upper arm/back, CP unrelieved by Nitro. Symptoms reported of SOB, nausea, vomiting, sweating.  1. Are you having CP right now?   No  2. Are you experiencing any other symptoms (ex. SOB, nausea, vomiting, sweating)?   No  3. Is your CP continuous or coming and going?   Coming and going  4. Have you taken Nitroglycerin?   No  5. How long have you been experiencing CP?   Son stated patient has been talking about this for a couple of weeks  6. If NO CP at time of call then end call with telling Pt to call back or call 911 if Chest pain returns prior to return call from triage team.   Son Tasia Catchings) stated patient told him he has been having a "shock" in his chest/stomach area.  Son wants a call back to advise on next steps.

## 2023-09-08 NOTE — Telephone Encounter (Signed)
Called and left message for patient to call back.  Also then called patient's son (DPR).  His son states that this "shocking" sensation he has been referring to for the past several weeks is not accompanied by any other symptoms that he is aware of.  He states that his dad is getting a little bit of dementia also so sometimes doesn't describe things accurately.   I offered 8:00 or 8:20 am this Friday 09/12/23 with Dr. Clifton Thierry.  It will be okay to use this time in the structural clinic.  If that day/time won't work for them, we can schedule him on 09/26/23 in the spot I have on hold.

## 2023-09-26 ENCOUNTER — Encounter: Payer: Self-pay | Admitting: Cardiovascular Disease

## 2023-09-26 ENCOUNTER — Ambulatory Visit: Payer: Medicare Other | Attending: Cardiovascular Disease | Admitting: Cardiovascular Disease

## 2023-09-26 VITALS — BP 140/80 | HR 65 | Ht 65.0 in | Wt 145.8 lb

## 2023-09-26 DIAGNOSIS — I2510 Atherosclerotic heart disease of native coronary artery without angina pectoris: Secondary | ICD-10-CM

## 2023-09-26 DIAGNOSIS — E785 Hyperlipidemia, unspecified: Secondary | ICD-10-CM

## 2023-09-26 DIAGNOSIS — I10 Essential (primary) hypertension: Secondary | ICD-10-CM | POA: Diagnosis not present

## 2023-09-26 NOTE — Progress Notes (Signed)
 Chief Complaint  Patient presents with   Follow-up    CAD   History of Present Illness: 83 yo Juan [name] with history of CAD, CKD, HLD and HTN who is here today for cardiac follow up.  He was seen by [facility] in [place], [place] in March 2023 with chest pain and had an abnormal nuclear stress test. Cardiac cath at [facility] with severe LAD stenosis and severe RCA stenosis. He was transferred to [facility] for CABG consult but his anatomy was not felt to be suitable for CABG. He underwent PCI with placement of a drug eluting stent in the LAD and two drug eluting stents in the RCA on 3/16//23. Echo 10/28/21 with LVEF=60-65%, mild MR.    He is here today for follow up. He is here with his son and daughter. They report that he has had audio and visual hallucinations over the past two years. The patient states that he has a man from [place]  chasing him around his house and popping him. This causes pain all over his body including his arms, chest and abdomen. His children are unable to redirect him in any way for the past year. He constantly talks about this man who is chasing him. He was not having these issues when I saw him in July 2023, 4 months post stent placement. He has been seen in primary care and Neurology for the hallucinations but no clear diagnosis has been given to the family for his paranoid thoughts and hallucinations. The patient denies any dyspnea, palpitations, lower extremity edema, orthopnea, PND, dizziness, near syncope or syncope. I am unable to communicate about his heart today as every conversation goes back to his paranoid thoughts. Head CT October 2024 without evidence of stroke but there is diffuse chronic small vessel changes in the white matter.   Primary Care Physician: Juan Lenis, MD  Past Medical History:  Diagnosis Date   Arthritis    Cataracts, both eyes    Hearing loss    left ear   History of kidney stones    Hypertension     Past Surgical History:  Procedure  Laterality Date   APPENDECTOMY     COLONOSCOPY WITH PROPOFOL  N/A 09/29/2018   Procedure: COLONOSCOPY WITH PROPOFOL ;  Surgeon: Gaylyn Gladis PENNER, MD;  Location: [facility] ENDOSCOPY;  Service: Endoscopy;  Laterality: N/A;   COLONOSCOPY WITH PROPOFOL  N/A 04/15/2019   Procedure: COLONOSCOPY WITH PROPOFOL ;  Surgeon: Gaylyn Gladis PENNER, MD;  Location: [facility] ENDOSCOPY;  Service: Endoscopy;  Laterality: N/A;   CORONARY STENT INTERVENTION N/A 11/01/2021   Procedure: CORONARY STENT INTERVENTION;  Surgeon: Verlin Lonni BIRCH, MD;  Location: [facility] INVASIVE CV LAB;  Service: Cardiovascular;  Laterality: N/A;   LEFT HEART CATH AND CORONARY ANGIOGRAPHY N/A 10/30/2021   Procedure: LEFT HEART CATH AND CORONARY ANGIOGRAPHY;  Surgeon: Hester Wolm PARAS, MD;  Location: [facility] INVASIVE CV LAB;  Service: Cardiovascular;  Laterality: N/A;   WART FULGURATION N/A 06/10/2019   Procedure: FULGURATION ANAL WART;  Surgeon: Tye Millet, DO;  Location: [facility] ORS;  Service: General;  Laterality: N/A;    Current Outpatient Medications  Medication Sig Dispense Refill   aspirin  EC 81 MG tablet TAKE 1 TABLET (81MG  TOTAL) BY MOUTH DAILY SWALLOW WHOLE 90 tablet 0   atorvastatin  (LIPITOR ) 80 MG tablet Take 1 tablet (80 mg total) by mouth daily. 90 tablet 3   ferrous gluconate  (FERGON) 324 MG tablet TAKE 1 TABLET BY MOUTH EVERY DAY WITH BREAKFAST 90 tablet 1   nitroGLYCERIN  (NITROSTAT ) 0.4  MG SL tablet Place 1 tablet (0.4 mg total) under the tongue every 5 (five) minutes x 3 doses as needed for chest pain. 25 tablet 1   tamsulosin  (FLOMAX ) 0.4 MG CAPS capsule Take 0.4 mg by mouth daily.     valsartan  (DIOVAN ) 80 MG tablet Take 1 tablet (80 mg total) by mouth daily. 90 tablet 3   Vitamins-Lipotropics (LIPO FLAVONOID PLUS PO) Take by mouth 2 (two) times daily. 2 tabs 2 times daily     clopidogrel  (PLAVIX ) 75 MG tablet TAKE 1 TABLET BY MOUTH EVERY DAY (Patient not taking: Reported on 09/26/2023) 90 tablet 3   No current facility-administered  medications for this visit.    Allergies  Allergen Reactions   Sulfa Antibiotics Other (See Comments)    Unknown reaction    Social History   Socioeconomic History   Marital status: Divorced    Spouse name: Not on file   Number of children: Not on file   Years of education: Not on file   Highest education level: Not on file  Occupational History   Not on file  Tobacco Use   Smoking status: Never   Smokeless tobacco: Never  Vaping Use   Vaping status: Never Used  Substance and Sexual Activity   Alcohol use: No   Drug use: No   Sexual activity: Not on file  Other Topics Concern   Not on file  Social History Narrative   Not on file   Social Drivers of Health   Financial Resource Strain: Low Risk  (04/18/2023)   Received from [facility]   Overall Financial Resource Strain (CARDIA)    Difficulty of Paying Living Expenses: Not very hard  Food Insecurity: No Food Insecurity (04/18/2023)   Received from [facility]   Hunger Vital Sign    Worried About Running Out of Food in the Last Year: Never true    Ran Out of Food in the Last Year: Never true  Transportation Needs: No Transportation Needs (04/18/2023)   Received from [facility]    In the past 12 months, has lack of transportation kept you from medical appointments or from getting medications?: No    Lack of Transportation (Non-Medical): No  Physical Activity: Not on file  Stress: Not on file  Social Connections: Not on file  Intimate Partner Violence: Not on file    Family History  Problem Relation Age of Onset   Hypertension Father     Review of Systems:  As stated in the HPI and otherwise negative.   BP (!) 140/80   Pulse 65   Ht 5' 5 (1.651 m)   Wt 66.1 kg   SpO2 96%   BMI 24.26 kg/m   Physical Examination: General: Well developed, well nourished, NAD  HEENT: OP clear, mucus membranes moist  SKIN: warm, dry. No  rashes. Neuro: No focal deficits  Musculoskeletal: Muscle strength 5/5 all ext  Psychiatric: Mood and affect normal  Neck: No JVD, no carotid bruits, no thyromegaly, no lymphadenopathy.  Lungs:Clear bilaterally, no wheezes, rhonci, crackles Cardiovascular: Regular rate and rhythm. No murmurs, gallops or rubs. Abdomen:Soft. Bowel sounds present. Non-tender.  Extremities: No lower extremity edema. Pulses are 2 + in the bilateral DP/PT.  EKG:  EKG is not ordered today. The ekg ordered today demonstrates   Recent Labs: No results found for requested labs within last 365 days.   Lipid Panel    Component Value Date/Time  CHOL 208 (H) 10/27/2021 0514   TRIG 63 10/27/2021 0514   HDL 71 10/27/2021 0514   CHOLHDL 2.9 10/27/2021 0514   VLDL 13 10/27/2021 0514   LDLCALC 124 (H) 10/27/2021 0514     Wt Readings from Last 3 Encounters:  09/26/23 66.1 kg  02/19/23 64.7 kg  03/18/22 63.7 kg    Assessment and Plan:   1. CAD without angina: No chest pain. Continue ASA  and Lipitor .     2. HTN: BP is well controlled. No changes  3. Hyperlipidemia: Continue statin.  4. Audio and Visual Hallucinations: I do not think his current issues are related to his cardiac procedures in 2023. I have advised his family to follow up with Neurology. It is not clear if this is vascular dementia or some other type of psychosis.   Labs/ tests ordered today include:  No orders of the defined types were placed in this encounter.  Disposition:   F/U with me in March 2024.   Signed, Lonni Cash, MD 09/26/2023 4:58 PM    [facility] [address] [place], [place], [place]  [address] Phone: [phone]; Fax: [phone]

## 2023-09-26 NOTE — Patient Instructions (Signed)

## 2023-10-03 IMAGING — DX DG CHEST 1V PORT
1 series · 1 of 1 positions shown · non-contrast
Comparison: 04/21/2016

CLINICAL DATA: Chest pain

EXAM:
PORTABLE CHEST 1 VIEW

[chest ap]
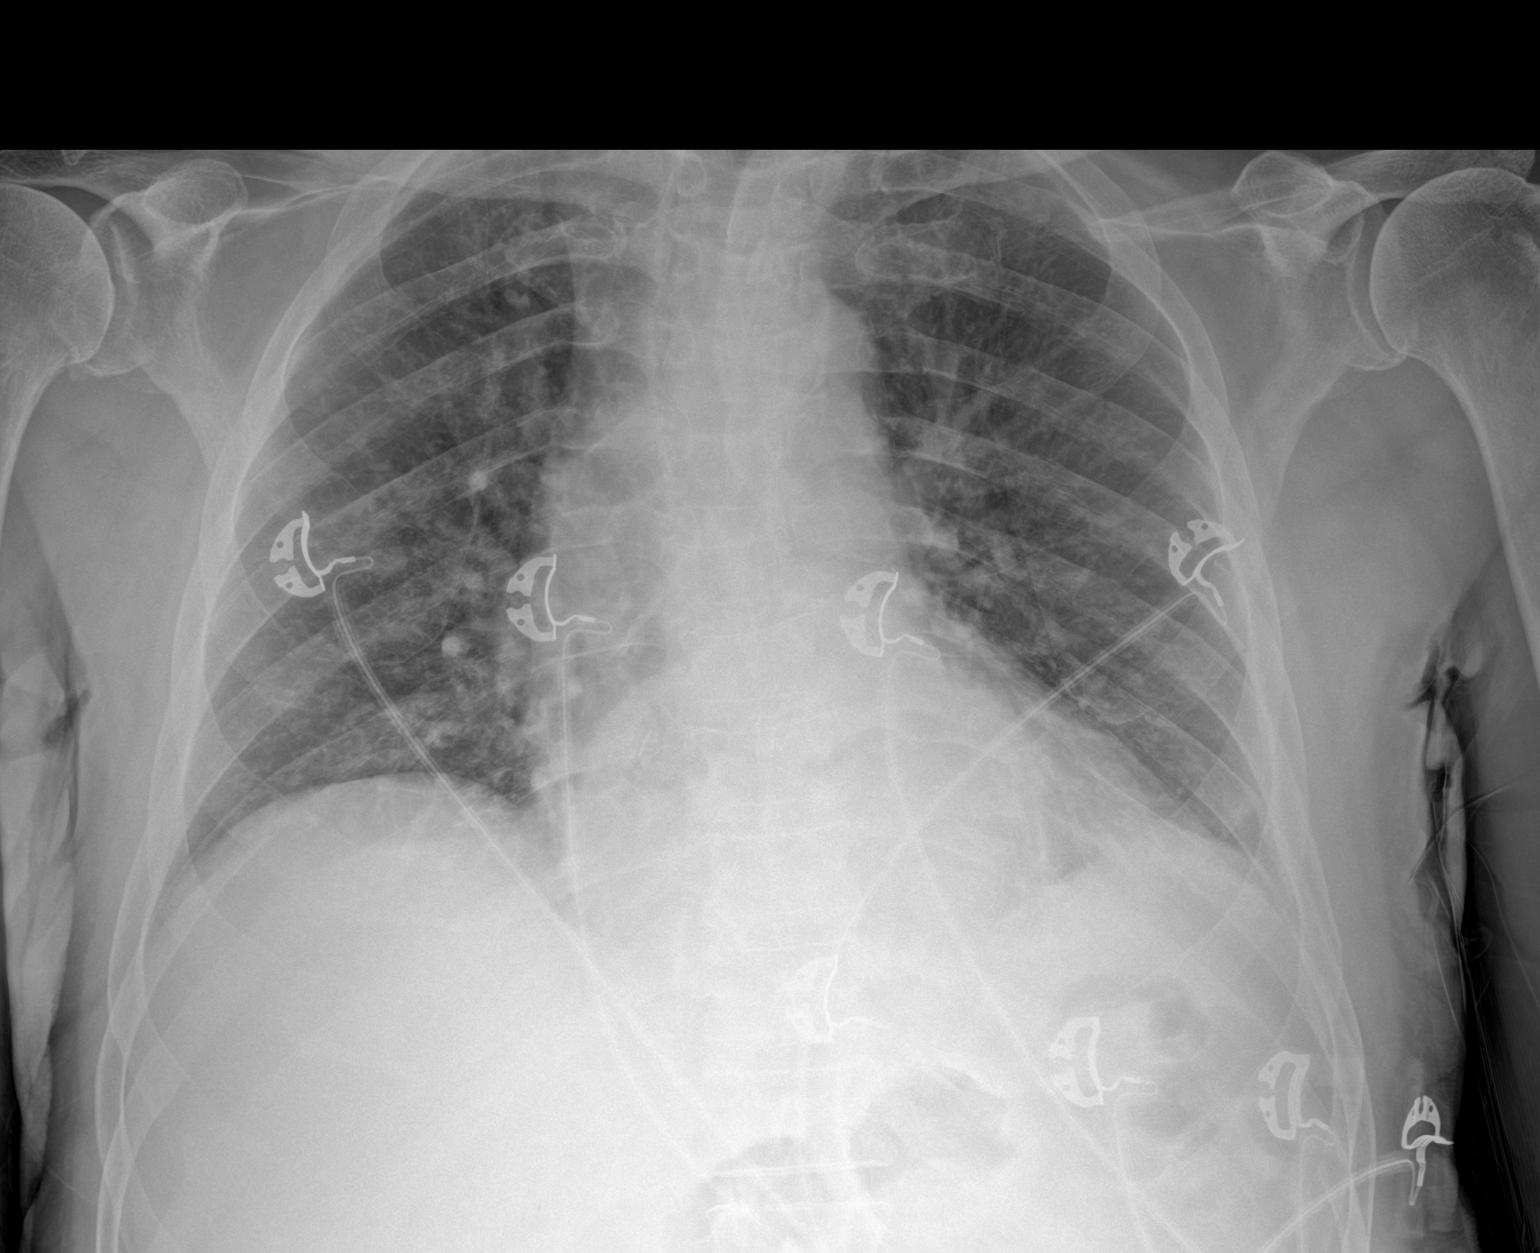

[1 of 1 positions shown; findings below may reference images not displayed]

FINDINGS: No focal opacity, pleural effusion, or pneumothorax. Normal
cardiomediastinal silhouette. Mild central vascular congestion.
Borderline cardiac size. Retrocardiac oval opacity potentially due
to hiatal hernia.
IMPRESSION: 1. Borderline cardiomegaly with slight central congestion
2. Ovoid retrocardiac opacity, suspect hiatal hernia, though could
correlate with lateral view of the chest.

## 2023-10-18 ENCOUNTER — Other Ambulatory Visit: Payer: Self-pay | Admitting: Cardiology

## 2023-11-17 ENCOUNTER — Ambulatory Visit: Payer: Medicare Other | Admitting: Cardiovascular Disease

## 2023-11-25 ENCOUNTER — Other Ambulatory Visit: Payer: Self-pay | Admitting: Cardiovascular Disease

## 2023-11-25 DIAGNOSIS — I214 Non-ST elevation (NSTEMI) myocardial infarction: Secondary | ICD-10-CM

## 2024-02-12 ENCOUNTER — Other Ambulatory Visit: Payer: Self-pay | Admitting: Nurse Practitioner

## 2024-02-28 DIAGNOSIS — F03C2 Unspecified dementia, severe, with psychotic disturbance: Secondary | ICD-10-CM | POA: Insufficient documentation

## 2024-03-07 ENCOUNTER — Other Ambulatory Visit: Payer: Self-pay | Admitting: Nurse Practitioner

## 2024-03-07 DIAGNOSIS — I214 Non-ST elevation (NSTEMI) myocardial infarction: Secondary | ICD-10-CM

## 2024-04-26 ENCOUNTER — Telehealth: Payer: Self-pay | Admitting: Neurology

## 2024-04-26 ENCOUNTER — Ambulatory Visit: Admitting: Neurology

## 2024-04-26 ENCOUNTER — Encounter: Payer: Self-pay | Admitting: Neurology

## 2024-04-26 VITALS — BP 152/84 | HR 72 | Ht 62.0 in | Wt 143.0 lb

## 2024-04-26 DIAGNOSIS — H903 Sensorineural hearing loss, bilateral: Secondary | ICD-10-CM

## 2024-04-26 DIAGNOSIS — R413 Other amnesia: Secondary | ICD-10-CM | POA: Insufficient documentation

## 2024-04-26 MED ORDER — DONEPEZIL HCL 10 MG PO TABS: 10.0000 mg | ORAL_TABLET | Freq: Every day | ORAL | 11 refills | Status: AC

## 2024-04-26 MED ORDER — MEMANTINE HCL 10 MG PO TABS: 10.0000 mg | ORAL_TABLET | Freq: Two times a day (BID) | ORAL | 11 refills | Status: DC

## 2024-04-26 NOTE — Telephone Encounter (Signed)
 Checking to verify appointment and directiona also if have any earlier openings.

## 2024-04-26 NOTE — Progress Notes (Unsigned)
 Chief Complaint  Patient presents with   New Patient (Initial Visit)    RM 14 MOCA   ASSESSMENT AND PLAN  Juan [name] is a 83 y.o. 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   MEDICAL HISTORY:  Juan [name] is a 83 year old male, accompanied by his daughter, seen in request by his primary care doctor for Glover Lenis, evaluation of dementia, initial evaluation was April 26, 2024,    History is obtained from the patient and review of electronic medical records. I personally reviewed pertinent available imaging films in PACS.   PMHx of  HTN  HLD History of kidney stone. 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, had a significant hearing loss, had right cochlear implant, but still has difficulty with his hearing, also hard to keep up with conversation due to his cognitive malfunction  He had slow worsening memory loss over the past few years, Live alone for many years, [place], mainetnence, herig loss, cochlear implant, retired at  He has two children,   Hard of hearing loss, some meory loss, with acute worsening since his heart attack in March 2023, had a cardiac catheter, stent placement,  He continued to lives alone, enjoy taking care of goats, but with increased difficulties, missing his meals, daughter check on him regularly, he is no longer driving, he also have hallucinations, hearing voices,  sometimes threatening, that is very bothersome for him  Not MRI candidate due to cochlear implant, CT head without contrast October 2024, artifact from right cochlear implant, no acute abnormality, generalized atrophy, moderate small vessel disease  PHYSICAL EXAM:   Vitals:   04/26/24 1404 04/26/24 1405  BP: (!) 160/80 (!) 152/84  Pulse: 72   SpO2: 96%   Weight: 143 lb (64.9 kg)   Height: 5' 2 (1.575 m)      Body mass index is 26.16 kg/m.  PHYSICAL EXAMNIATION:  Gen: NAD, conversant, well nourised, well groomed                     Cardiovascular: Regular rate rhythm, no peripheral edema, warm, nontender. Eyes: Conjunctivae clear without exudates or hemorrhage Neck: Supple, no carotid bruits. Pulmonary: Clear to auscultation bilaterally   NEUROLOGICAL EXAM:  MENTAL STATUS: Speech/cognition: Very hard of hearing, difficulty following commands, could not perform Mini-Mental status examination CRANIAL NERVES: CN II: Visual fields are full to confrontation. Pupils are round equal and briskly reactive to light. CN III, IV, VI: extraocular movement are normal. No ptosis. CN V: Facial sensation is intact to light touch CN VII: Face is symmetric with normal eye closure  CN VIII: Wearing right cochlear implant, hard of hearing, CN IX, X: Phonation is normal. CN XI: Head turning and shoulder shrug are intact  MOTOR: There is no pronator drift of out-stretched arms. Muscle bulk and tone are normal. Muscle strength is normal.  REFLEXES: Reflexes are 1 and symmetric at the biceps, triceps, knees, and ankles. Plantar responses are flexor.  SENSORY: Intact to light touch,    COORDINATION: There is no trunk or limb dysmetria noted.  GAIT/STANCE: Push-up, cautious  REVIEW OF SYSTEMS:  Full 14 system review of systems performed and notable only for as above All other review of systems were negative.   ALLERGIES: Allergies  Allergen Reactions   Sulfa Antibiotics Other (See  Comments)    Unknown reaction    HOME MEDICATIONS: Current Outpatient Medications  Medication Sig Dispense Refill   aspirin  EC (ASPIRIN  LOW DOSE) 81 MG tablet Take 1 tablet (81 mg total) by mouth daily. SWALLOW WHOLE 90 tablet 3   atorvastatin  (LIPITOR ) 80 MG tablet TAKE 1 TABLET BY MOUTH EVERY DAY 90 tablet 0   clopidogrel  (PLAVIX ) 75 MG tablet TAKE 1 TABLET BY MOUTH EVERY DAY 90 tablet 3   ferrous gluconate  (FERGON) 324 MG tablet TAKE 1 TABLET BY MOUTH EVERY DAY WITH BREAKFAST 90 tablet 3   nitroGLYCERIN  (NITROSTAT ) 0.4 MG SL tablet Place 1 tablet (0.4 mg total) under the tongue every 5 (five) minutes x 3 doses as needed for chest pain. 25 tablet 1   tamsulosin  (FLOMAX ) 0.4 MG CAPS capsule Take 0.4 mg by mouth daily.     valsartan  (DIOVAN ) 80 MG tablet TAKE 1 TABLET BY MOUTH EVERY DAY 90 tablet 2   Vitamins-Lipotropics (LIPO FLAVONOID PLUS PO) Take by mouth 2 (two) times daily. 2 tabs 2 times daily     No current facility-administered medications for this visit.    PAST MEDICAL HISTORY: Past Medical History:  Diagnosis Date   Arthritis    Cataracts, both eyes    Hearing loss    left ear   History of kidney stones    Hypertension     PAST SURGICAL HISTORY: Past Surgical History:  Procedure Laterality Date   APPENDECTOMY     COLONOSCOPY WITH PROPOFOL  N/A 09/29/2018   Procedure: COLONOSCOPY WITH PROPOFOL ;  Surgeon: Gaylyn Gladis PENNER, MD;  Location: [facility] ENDOSCOPY;  Service: Endoscopy;  Laterality: N/A;   COLONOSCOPY WITH PROPOFOL  N/A 04/15/2019   Procedure: COLONOSCOPY WITH PROPOFOL ;  Surgeon: Gaylyn Gladis PENNER, MD;  Location: [facility] ENDOSCOPY;  Service: Endoscopy;  Laterality: N/A;   CORONARY STENT INTERVENTION N/A 11/01/2021   Procedure: CORONARY STENT INTERVENTION;  Surgeon: Verlin Lonni BIRCH, MD;  Location: [facility] INVASIVE CV LAB;  Service: Cardiovascular;  Laterality: N/A;   LEFT HEART CATH AND CORONARY ANGIOGRAPHY N/A 10/30/2021   Procedure: LEFT HEART CATH AND CORONARY  ANGIOGRAPHY;  Surgeon: Hester Wolm PARAS, MD;  Location: [facility] INVASIVE CV LAB;  Service: Cardiovascular;  Laterality: N/A;   WART FULGURATION N/A 06/10/2019   Procedure: FULGURATION ANAL WART;  Surgeon: Tye Millet, DO;  Location: [facility] ORS;  Service: General;  Laterality: N/A;    FAMILY HISTORY: Family History  Problem Relation Age of Onset   Hypertension Father     SOCIAL HISTORY: Social History   Socioeconomic History   Marital status: Divorced    Spouse name: Not on file   Number of children: Not on file   Years of education: Not on file   Highest education level: Not on file  Occupational History   Not on file  Tobacco Use   Smoking status: Never   Smokeless tobacco: Never  Vaping Use   Vaping status: Never Used  Substance and Sexual Activity   Alcohol use: No   Drug use: No   Sexual activity: Not on file  Other Topics Concern   Not on file  Social History Narrative   Right  handed    Lives alone with cats and goats   Social Drivers of Health   Financial Resource Strain: Low Risk  (04/18/2023)   Received from [facility]   Overall Financial Resource Strain (CARDIA)    Difficulty of Paying Living Expenses: Not very hard  Food Insecurity: No Food Insecurity (01/02/2024)   Received from [facility]   Hunger Vital Sign    Within the past 12 months, you worried that your food would run out before you got the money to buy more.: Never true    Within the past 12 months, the food you bought just didn't last and you didn't have money to get more.: Never true  Transportation Needs: No Transportation Needs (01/02/2024)   Received from [facility]    Lack of Transportation (Medical): No    Lack of Transportation (Non-Medical): No  Physical Activity: Not on file  Stress: Not on file  Social Connections: Not on file  Intimate Partner Violence: Not At Risk (01/02/2024)   Received from [facility]   Humiliation,  Afraid, Rape, and Kick questionnaire    Within the last year, have you been afraid of your partner or ex-partner?: No    Within the last year, have you been humiliated or emotionally abused in other ways by your partner or ex-partner?: No    Within the last year, have you been kicked, hit, slapped, or otherwise physically hurt by your partner or ex-partner?: No    Within the last year, have you been raped or forced to have any kind of sexual activity by your partner or ex-partner?: No      Modena Callander, M.D. Ph.D.  [facility] [address], [address] [place], [place] [address] Ph: [phone] Fax: [phone]  CC:  Glover Lenis, MD [address] [place],  [place] [address]  Glover Lenis, MD

## 2024-04-29 LAB — ATN PROFILE
A -- Beta-amyloid 42/40 Ratio: 0.104 (ref >0.102)
Beta-amyloid 40: 260.98 pg/mL
Beta-amyloid 42: 27.02 pg/mL
N -- NfL, Plasma: 5.04 pg/mL (ref 0.00–9.13)
T -- p-tau181: 2.77 pg/mL — AB (ref 0.00–0.97)

## 2024-04-30 ENCOUNTER — Telehealth: Payer: Self-pay | Admitting: Neurology

## 2024-04-30 NOTE — Telephone Encounter (Signed)
 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

## 2024-05-03 NOTE — Telephone Encounter (Signed)
 Returned called to pts daughter and relayed results. Pts daughter would like to know if he had a stroke. Pts daughter is adamant about wanting to get to the root of the problem and would like further testing to pin point the problem. Pts daughter stated that she had a form that would still allow the permission to drive can they have a letter that states that he is allowed to drive.Th familys plans to actually not let him drive. They are afraid that the pt will get failure thrive as that is one of his greatest pleasures.  Pt daughter stated that she would like clarity if she should take aricept  and namenda  because daughter stated that she was told not to take both together during visit. Pt daughter stated that she saw him standing an urinating in his pants and the floor. She is frustrated and needing clarity on how to proceed. She would prefer a call directly from Dr. ONITA.

## 2024-05-03 NOTE — Telephone Encounter (Signed)
 Lvm 1st attempt by hf 05/03/24

## 2024-05-03 NOTE — Telephone Encounter (Signed)
 Pt's daughter is asking for a call back from CMA

## 2024-05-04 NOTE — Telephone Encounter (Signed)
 I called patient's daughter, explained laboratory findings, elevated P tau 181, the result indicating pathology of the brain, but not specific enough for the presence of Alzheimer's related pathology   Also explained he should start Namenda  10 mg twice a day, once his body get used to it, add on Aricept  10 mg daily,  During his office visit on April 26, 2024, he was found to have significant hearing loss, slow reaction, difficulty following commands, could not complete Mini-Mental status examination, I would not feel comfortable for him continue driving.

## 2024-05-08 ENCOUNTER — Emergency Department (HOSPITAL_COMMUNITY)

## 2024-05-08 ENCOUNTER — Emergency Department (HOSPITAL_COMMUNITY)
Admission: EM | Admit: 2024-05-08 | Discharge: 2024-05-08 | Disposition: A | Discharge status: Home / Self Care | Attending: Emergency Medicine | Admitting: Emergency Medicine

## 2024-05-08 DIAGNOSIS — Z7982 Long term (current) use of aspirin: Secondary | ICD-10-CM | POA: Diagnosis not present

## 2024-05-08 DIAGNOSIS — R799 Abnormal finding of blood chemistry, unspecified: Secondary | ICD-10-CM | POA: Diagnosis not present

## 2024-05-08 DIAGNOSIS — F0390 Unspecified dementia without behavioral disturbance: Secondary | ICD-10-CM | POA: Insufficient documentation

## 2024-05-08 DIAGNOSIS — R410 Disorientation, unspecified: Secondary | ICD-10-CM | POA: Diagnosis present

## 2024-05-08 DIAGNOSIS — F03C0 Unspecified dementia, severe, without behavioral disturbance, psychotic disturbance, mood disturbance, and anxiety: Secondary | ICD-10-CM

## 2024-05-08 LAB — COMPREHENSIVE METABOLIC PANEL WITH GFR
ALT: 19 U/L (ref 0–44)
AST: 27 U/L (ref 15–41)
Albumin: 4.4 g/dL (ref 3.5–5.0)
Alkaline Phosphatase: 62 U/L (ref 38–126)
Anion gap: 11 (ref 5–15)
BUN: 39 mg/dL — ABNORMAL HIGH (ref 8–23)
CO2: 24 mmol/L (ref 22–32)
Calcium: 10.3 mg/dL (ref 8.9–10.3)
Chloride: 107 mmol/L (ref 98–111)
Creatinine, Ser: 1.54 mg/dL — ABNORMAL HIGH (ref 0.61–1.24)
GFR, Estimated: 44 mL/min — ABNORMAL LOW (ref >60)
Glucose, Bld: 116 mg/dL — ABNORMAL HIGH (ref 70–99)
Potassium: 4.3 mmol/L (ref 3.5–5.1)
Sodium: 142 mmol/L (ref 135–145)
Total Bilirubin: 0.5 mg/dL (ref 0.0–1.2)
Total Protein: 6.9 g/dL (ref 6.5–8.1)

## 2024-05-08 LAB — CBC WITH DIFFERENTIAL/PLATELET
Abs Immature Granulocytes: 0.02 K/uL (ref 0.00–0.07)
Basophils Absolute: 0 K/uL (ref 0.0–0.1)
Basophils Relative: 0 %
Eosinophils Absolute: 0.1 K/uL (ref 0.0–0.5)
Eosinophils Relative: 2 %
HCT: 42.1 % (ref 39.0–52.0)
Hemoglobin: 13.6 g/dL (ref 13.0–17.0)
Immature Granulocytes: 0 %
Lymphocytes Relative: 25 %
Lymphs Abs: 1.8 K/uL (ref 0.7–4.0)
MCH: 31.1 pg (ref 26.0–34.0)
MCHC: 32.3 g/dL (ref 30.0–36.0)
MCV: 96.1 fL (ref 80.0–100.0)
Monocytes Absolute: 0.7 K/uL (ref 0.1–1.0)
Monocytes Relative: 9 %
Neutro Abs: 4.5 K/uL (ref 1.7–7.7)
Neutrophils Relative %: 64 %
Platelets: 177 K/uL (ref 150–400)
RBC: 4.38 MIL/uL (ref 4.22–5.81)
RDW: 13.2 % (ref 11.5–15.5)
WBC: 7.2 K/uL (ref 4.0–10.5)
nRBC: 0 % (ref 0.0–0.2)

## 2024-05-08 LAB — I-STAT CHEM 8, ED
BUN: 39 mg/dL — ABNORMAL HIGH (ref 8–23)
Calcium, Ion: 1.36 mmol/L (ref 1.15–1.40)
Chloride: 109 mmol/L (ref 98–111)
Creatinine, Ser: 1.7 mg/dL — ABNORMAL HIGH (ref 0.61–1.24)
Glucose, Bld: 111 mg/dL — ABNORMAL HIGH (ref 70–99)
HCT: 41 % (ref 39.0–52.0)
Hemoglobin: 13.9 g/dL (ref 13.0–17.0)
Potassium: 4.2 mmol/L (ref 3.5–5.1)
Sodium: 143 mmol/L (ref 135–145)
TCO2: 25 mmol/L (ref 22–32)

## 2024-05-08 LAB — URINALYSIS, W/ REFLEX TO CULTURE (INFECTION SUSPECTED)
Bacteria, UA: NONE SEEN
Bilirubin Urine: NEGATIVE
Glucose, UA: NEGATIVE mg/dL
Hgb urine dipstick: NEGATIVE
Ketones, ur: NEGATIVE mg/dL
Leukocytes,Ua: NEGATIVE
Nitrite: NEGATIVE
Protein, ur: NEGATIVE mg/dL
Specific Gravity, Urine: 1.02 (ref 1.005–1.030)
pH: 5 (ref 5.0–8.0)

## 2024-05-08 LAB — I-STAT CG4 LACTIC ACID, ED
Lactic Acid, Venous: 1.1 mmol/L (ref 0.5–1.9)
Lactic Acid, Venous: 0.7 mmol/L (ref 0.5–1.9)

## 2024-05-08 LAB — TROPONIN T, HIGH SENSITIVITY
Troponin T High Sensitivity: 36 ng/L — ABNORMAL HIGH (ref 0–19)
Troponin T High Sensitivity: 31 ng/L — ABNORMAL HIGH (ref 0–19)

## 2024-05-08 LAB — CBG MONITORING, ED: Glucose-Capillary: 82 mg/dL (ref 70–99)

## 2024-05-08 NOTE — Discharge Instructions (Signed)
 1.  Call Dr. Georgianne office to schedule follow-up as soon as possible.  It is possible that you had a TIA.  At this time I do not identify specific stroke symptoms.  Dr. Onita may opt to do an MRI or further evaluation. 2.  You reported increasing need for home help for dementia and difficulty living independently.  A consultation has been placed for home health.  Also work with your doctor closely and establishing long-term care plans.

## 2024-05-08 NOTE — ED Triage Notes (Signed)
 For 3 days, pt has had weakness on left side, difficulty walking, drooling- brought in by son

## 2024-05-08 NOTE — ED Provider Notes (Signed)
 [facility] AT [facility] Provider Note   CSN: [id number] Arrival date & time: 05/08/24  8147     Patient presents with: Weakness   Juan [name] is a 83 y.o. male.   HPI Patient has been having increasing problems with memory loss and confusion.  Patient does have diagnosis of dementia.  Over the past number of days, patient's son advises that they have noticed some increased appearance of drooling from the left side of the mouth, more unsteady than baseline gait with some appearance of some swelling of the left ankle or foot.  Patient son reports they were unsure if he might of had a TIA.  No fevers no chills no other specific symptoms.  Patient is still living independently with people coming in frequently to help.  Patient's son reports they realize they are at a point with patient's level of dementia that they will need additional help and planning for long-term care.    Prior to Admission medications   Medication Sig Start Date End Date Taking? Authorizing Provider  aspirin  EC (ASPIRIN  LOW DOSE) 81 MG tablet Take 1 tablet (81 mg total) by mouth daily. SWALLOW WHOLE 10/21/23   Verlin Lonni JONETTA, MD  atorvastatin  (LIPITOR ) 80 MG tablet TAKE 1 TABLET BY MOUTH EVERY DAY 03/09/24   Verlin Lonni JONETTA, MD  donepezil  (ARICEPT ) 10 MG tablet Take 1 tablet (10 mg total) by mouth at bedtime. 04/26/24   Onita Duos, MD  ferrous gluconate  (FERGON) 324 MG tablet TAKE 1 TABLET BY MOUTH EVERY DAY WITH BREAKFAST 10/21/23   Tobb, Kardie, DO  memantine  (NAMENDA ) 10 MG tablet Take 1 tablet (10 mg total) by mouth 2 (two) times daily. 04/26/24   Onita Duos, MD  nitroGLYCERIN  (NITROSTAT ) 0.4 MG SL tablet Place 1 tablet (0.4 mg total) under the tongue every 5 (five) minutes x 3 doses as needed for chest pain. 11/02/21   Henry Manuelita NOVAK, NP  tamsulosin  (FLOMAX ) 0.4 MG CAPS capsule Take 0.4 mg by mouth daily.    [provider]  valsartan  (DIOVAN ) 80 MG tablet  TAKE 1 TABLET BY MOUTH EVERY DAY 02/16/24   Verlin Lonni JONETTA, MD  Vitamins-Lipotropics (LIPO FLAVONOID PLUS PO) Take by mouth 2 (two) times daily. 2 tabs 2 times daily    [provider]    Allergies: Sulfa antibiotics    Review of Systems  Updated Vital Signs BP 125/68   Pulse (!) 56   Temp 98.1 F (36.7 C) (Oral)   Resp 16   SpO2 97%   Physical Exam Constitutional:      Comments: Patient is a pleasant.  No acute distress.  No respiratory distress.  HENT:     Head: Normocephalic and atraumatic.     Mouth/Throat:     Mouth: Mucous membranes are moist.     Pharynx: Oropharynx is clear.  Eyes:     Extraocular Movements: Extraocular movements intact.  Cardiovascular:     Rate and Rhythm: Normal rate and regular rhythm.  Pulmonary:     Effort: Pulmonary effort is normal.     Breath sounds: Normal breath sounds.  Abdominal:     General: There is no distension.     Palpations: Abdomen is soft.     Tenderness: There is no abdominal tenderness. There is no guarding.  Musculoskeletal:     Comments: Patient does not seem to have significant swelling asymmetry from left to right.  Patient appears to have some mild chronic joint hypertrophy  of the left ankle relative to the right but the foot itself does not appear significantly swollen.  Both feet are examined and there are no active wounds.  Feet are warm and dry.  Lower legs are pliable with soft calves.  Skin:    General: Skin is warm and dry.  Neurological:     Comments: Patient is alert and cheerful.  He is very hard of hearing.  He does answer some questions regarding his living arrangement, his symptoms but does admit he has poor recall.  Patient can follow commands but with significant hard of hearing is somewhat difficult to communicate effectively.  No focal motor deficits identified.  No obvious facial droop.  Facial movements to my observation are symmetric.  Patient's tongue extrusion is symmetric.  Patient's  speech is clear with some halting speech and recall problems.  No focal motor deficits of the upper extremities.  Symmetric use and symmetric grip strength bilateral upper extremities.  Symmetric ability to elevate each lower extremity off of the bed and hold against resistance.  Psychiatric:        Mood and Affect: Mood normal.     (all labs ordered are listed, but only abnormal results are displayed) Labs Reviewed  COMPREHENSIVE METABOLIC PANEL WITH GFR - Abnormal; Notable for the following components:      Result Value   Glucose, Bld 116 (*)    BUN 39 (*)    Creatinine, Ser 1.54 (*)    GFR, Estimated 44 (*)    All other components within normal limits  I-STAT CHEM 8, ED - Abnormal; Notable for the following components:   BUN 39 (*)    Creatinine, Ser 1.70 (*)    Glucose, Bld 111 (*)    All other components within normal limits  TROPONIN T, HIGH SENSITIVITY - Abnormal; Notable for the following components:   Troponin T High Sensitivity 36 (*)    All other components within normal limits  TROPONIN T, HIGH SENSITIVITY - Abnormal; Notable for the following components:   Troponin T High Sensitivity 31 (*)    All other components within normal limits  CBC WITH DIFFERENTIAL/PLATELET  URINALYSIS, W/ REFLEX TO CULTURE (INFECTION SUSPECTED)  I-STAT CG4 LACTIC ACID, ED  I-STAT CG4 LACTIC ACID, ED  CBG MONITORING, ED    EKG: EKG Interpretation Date/Time:  Saturday May 08 2024 19:31:42 EDT Ventricular Rate:  60 PR Interval:  163 QRS Duration:  94 QT Interval:  403 QTC Calculation: 403 R Axis:   62  Text Interpretation: Sinus rhythm Atrial premature complex Low voltage, precordial leads no ischemic appearnce. no sig change from previous Confirmed by Armenta Canning [phone]) on 05/08/2024 9:17:32 PM  Radiology: CT Head Wo Contrast Result Date: 05/08/2024 EXAM: CT HEAD WITHOUT CONTRAST 05/08/2024 07:21:15 PM TECHNIQUE: CT of the head was performed without the administration of  intravenous contrast. Automated exposure control, iterative reconstruction, and/or weight based adjustment of the mA/kV was utilized to reduce the radiation dose to as low as reasonably achievable. COMPARISON: 02/16/2024 CLINICAL HISTORY: Mental status change, unknown cause. For 3 days, pt has had weakness on left side, difficulty walking, drooling- brought in by son. FINDINGS: BRAIN AND VENTRICLES: No acute hemorrhage. No evidence of acute infarct. No hydrocephalus. No extra-axial collection. No mass effect or midline shift. Subcortical and periventricular small vessel ischemic changes. Mild age-related atrophy. ORBITS: No acute abnormality. SINUSES: No acute abnormality. SOFT TISSUES AND SKULL: No acute soft tissue abnormality. No skull fracture. Right cochlear implant with mastoidectomy.  VASCULATURE: Intracranial atherosclerosis. IMPRESSION: 1. No acute intracranial abnormality. Electronically signed by: Pinkie Pebbles MD 05/08/2024 07:25 PM EDT RP Workstation: [id number]     Procedures   Medications Ordered in the ED - No data to display                                  Medical Decision Making  Patient presents as outlined.  There have been longstanding decline in patient's baseline function with a diagnosis of dementia and dementia workup.  Patient and son reports they do however feel that over the past 3 or so days there has been some level of increase of baseline confusion and a question of possible left-sided facial droop and a gait change.  There was concern for possible TIA or small stroke.  Will proceed with CT head, basic lab work  Lactic 0.7 troponin 31 GFR 44 glucose 116 otherwise normal CBC normal  CT head interpreted by radiology no acute finding.  UA pending.  I have had long conversation with the patient's son regarding ongoing care issues.  They do intend to work with PCP and social work to increase home care and discuss possible placement ultimately.  I have placed a  consult for social work.  We also discussed following up with the patient's neurologist and discussing outpatient MRI further diagnostic evaluation of possible subacute stroke and/or other MRI identifiable etiology for patient's advancing dementia.    Final diagnoses:  Severe dementia, unspecified dementia type, unspecified whether behavioral, psychotic, or mood disturbance or anxiety [facility])  Confusion    ED Discharge Orders          Ordered    Home Health        05/08/24 2306    Face-to-face encounter (required for Medicare/Medicaid patients)       Comments: I Ludivina Shines certify that this patient is under my care and that I, or a nurse practitioner or physician's assistant working with me, had a face-to-face encounter that meets the physician face-to-face encounter requirements with this patient on 05/08/2024. The encounter with the patient was in whole, or in part for the following medical condition(s) which is the primary reason for home health care (List medical condition): unsteady gait and confusion due to dementia   05/08/24 2306               Shines Ludivina, MD 05/08/24 2310

## 2024-05-08 NOTE — ED Provider Triage Note (Signed)
 Emergency Medicine Provider Triage Evaluation Note  Juan [name] , a 83 y.o. male  was evaluated in triage.  Pt complains of AMS, weakness, confusion.  Patient was prompted by son.   Patient with 3 days of increased confusion, possible left facial droop, unsteady gait.  Symptoms began at least 3 days ago.  Patient is very hard of hearing and demented.  He appears to be comfortable on initial exam.  He appears to be grossly intact neurologically.  Exam is difficult given patient's inability to hear and follow directions.  Review of Systems  Positive: weakness Negative: fever  Physical Exam  BP 113/86 (BP Location: Right Arm)   Pulse 79   Temp 98.1 F (36.7 C) (Oral)   Resp 18   SpO2 98%  Gen:   Awake, no distress   Resp:  Normal effort  MSK:   Moves extremities without difficulty     Medical Decision Making  Medically screening exam initiated at 7:14 PM.  Appropriate orders placed.  Juan [name] was informed that the remainder of the evaluation will be completed by another provider, this initial triage assessment does not replace that evaluation, and the importance of remaining in the ED until their evaluation is complete.     Juan Maude BROCKS, MD 05/08/24 [phone]

## 2024-05-08 NOTE — ED Notes (Signed)
 Pt eating and drinking.  Attempted to get urine sample at bedside, unable to give urine sample at this time.

## 2024-05-09 ENCOUNTER — Telehealth: Payer: Self-pay

## 2024-05-09 NOTE — Telephone Encounter (Signed)
 Patient was ordered Home health last night. No HH previous in [place]. Called [facility] and Darleene Gowda accepted for [facility], He will reach out to the paitnet to schedule

## 2024-05-09 NOTE — Telephone Encounter (Signed)
 Patient was seen in the ED last night and Home health orders were placed. Messaged Cheryl Rose for acceptance for services

## 2024-05-26 ENCOUNTER — Other Ambulatory Visit: Payer: Self-pay | Admitting: Neurology

## 2024-06-11 ENCOUNTER — Other Ambulatory Visit: Payer: Self-pay | Admitting: Cardiovascular Disease

## 2024-06-11 DIAGNOSIS — I214 Non-ST elevation (NSTEMI) myocardial infarction: Secondary | ICD-10-CM
